# Patient Record
Sex: Female | Born: 1980 | Race: White | Hispanic: Yes | Marital: Married | Smoking: Never smoker
Health system: Southern US, Community
[De-identification: ages and names within clinical notes are randomized; demographics above are authoritative.]

## PROBLEM LIST (undated history)

## (undated) DIAGNOSIS — O009 Unspecified ectopic pregnancy without intrauterine pregnancy: Secondary | ICD-10-CM

## (undated) DIAGNOSIS — D649 Anemia, unspecified: Secondary | ICD-10-CM

## (undated) DIAGNOSIS — I8393 Asymptomatic varicose veins of bilateral lower extremities: Secondary | ICD-10-CM

## (undated) DIAGNOSIS — O09529 Supervision of elderly multigravida, unspecified trimester: Secondary | ICD-10-CM

## (undated) HISTORY — PX: OTHER SURGICAL HISTORY: SHX169

---

## 2002-09-22 ENCOUNTER — Inpatient Hospital Stay (HOSPITAL_COMMUNITY): Admission: AD | Admit: 2002-09-22 | Discharge: 2002-09-22 | Payer: Self-pay | Admitting: Family Medicine

## 2002-12-13 ENCOUNTER — Ambulatory Visit (HOSPITAL_COMMUNITY): Admission: RE | Admit: 2002-12-13 | Discharge: 2002-12-13 | Payer: Self-pay | Admitting: Obstetrics and Gynecology

## 2003-02-23 ENCOUNTER — Ambulatory Visit (HOSPITAL_COMMUNITY): Admission: RE | Admit: 2003-02-23 | Discharge: 2003-02-23 | Payer: Self-pay | Admitting: Obstetrics and Gynecology

## 2003-04-11 ENCOUNTER — Inpatient Hospital Stay (HOSPITAL_COMMUNITY): Admission: AD | Admit: 2003-04-11 | Discharge: 2003-04-11 | Payer: Self-pay | Admitting: Obstetrics and Gynecology

## 2003-05-22 ENCOUNTER — Inpatient Hospital Stay (HOSPITAL_COMMUNITY): Admission: AD | Admit: 2003-05-22 | Discharge: 2003-05-26 | Payer: Self-pay | Admitting: Obstetrics & Gynecology

## 2003-05-23 ENCOUNTER — Encounter (INDEPENDENT_AMBULATORY_CARE_PROVIDER_SITE_OTHER): Payer: Self-pay | Admitting: *Deleted

## 2005-11-21 ENCOUNTER — Encounter (INDEPENDENT_AMBULATORY_CARE_PROVIDER_SITE_OTHER): Payer: Self-pay | Admitting: Specialist

## 2005-11-21 ENCOUNTER — Ambulatory Visit: Payer: Self-pay | Admitting: Gynecology

## 2005-11-21 ENCOUNTER — Inpatient Hospital Stay (HOSPITAL_COMMUNITY): Admission: AD | Admit: 2005-11-21 | Discharge: 2005-11-21 | Payer: Self-pay | Admitting: Gynecology

## 2005-11-21 ENCOUNTER — Encounter: Payer: Self-pay | Admitting: Internal Medicine

## 2005-12-26 ENCOUNTER — Ambulatory Visit: Payer: Self-pay | Admitting: Gynecology

## 2006-02-12 ENCOUNTER — Other Ambulatory Visit: Admission: RE | Admit: 2006-02-12 | Discharge: 2006-02-12 | Payer: Self-pay | Admitting: Gynecology

## 2006-02-12 ENCOUNTER — Encounter (INDEPENDENT_AMBULATORY_CARE_PROVIDER_SITE_OTHER): Payer: Self-pay | Admitting: Specialist

## 2006-02-12 ENCOUNTER — Ambulatory Visit: Payer: Self-pay | Admitting: Gynecology

## 2006-03-04 ENCOUNTER — Ambulatory Visit: Payer: Self-pay | Admitting: Obstetrics & Gynecology

## 2006-06-23 ENCOUNTER — Ambulatory Visit (HOSPITAL_COMMUNITY): Admission: RE | Admit: 2006-06-23 | Discharge: 2006-06-23 | Payer: Self-pay | Admitting: Obstetrics & Gynecology

## 2006-08-25 ENCOUNTER — Ambulatory Visit (HOSPITAL_COMMUNITY): Admission: RE | Admit: 2006-08-25 | Discharge: 2006-08-25 | Payer: Self-pay | Admitting: Obstetrics & Gynecology

## 2006-10-03 ENCOUNTER — Ambulatory Visit: Payer: Self-pay | Admitting: Certified Nurse Midwife

## 2006-10-03 ENCOUNTER — Inpatient Hospital Stay (HOSPITAL_COMMUNITY): Admission: AD | Admit: 2006-10-03 | Discharge: 2006-10-03 | Payer: Self-pay | Admitting: Obstetrics & Gynecology

## 2006-10-07 ENCOUNTER — Ambulatory Visit: Payer: Self-pay | Admitting: Gynecology

## 2006-10-07 ENCOUNTER — Inpatient Hospital Stay (HOSPITAL_COMMUNITY): Admission: AD | Admit: 2006-10-07 | Discharge: 2006-10-09 | Payer: Self-pay | Admitting: Obstetrics & Gynecology

## 2006-10-20 ENCOUNTER — Ambulatory Visit: Payer: Self-pay | Admitting: Obstetrics & Gynecology

## 2006-10-23 ENCOUNTER — Ambulatory Visit: Payer: Self-pay | Admitting: Gynecology

## 2006-11-12 ENCOUNTER — Ambulatory Visit: Payer: Self-pay | Admitting: Gynecology

## 2010-09-17 NOTE — Op Note (Signed)
Katelyn Miller, Katelyn Miller    ACCOUNT NO.:  [id number]   MEDICAL RECORD NO.:  [id number]          PATIENT TYPE:  INP   LOCATION:  9102                          [facility]ILITY[facility]  [facility]   PHYSICIAN:  Ginger Carne, MD  DATE OF BIRTH:  [birth year]   DATE OF PROCEDURE:  10/07/2006  DATE OF DISCHARGE:                               OPERATIVE REPORT   PREOPERATIVE DIAGNOSIS:  Intrauterine pregnancy at 41+ weeks' gestation,  history of previous cesarean section, failed trial of labor after  cesarean section, nonreassuring fetal heart tracing, and had late  prenatal care.   POSTOPERATIVE DIAGNOSIS:  Intrauterine pregnancy at 41+ weeks'  gestation, history of previous cesarean section, failed trial of labor  after cesarean section, nonreassuring fetal heart tracing, and had late  prenatal care.   PROCEDURE:  Repeat low transverse cesarean section.   SURGEON:  Ginger Carne, M.D.   ASSISTANT:  Dr. Wilburt Finlay   ANESTHESIA:  Spinal.   SPECIMEN:  Placenta was sent to labor and delivery.   ESTIMATED BLOOD LOSS:  600 mL   COMPLICATIONS:  None.   FINDINGS:  Viable female infant, Apgars of eight and nine at 1 and 5  minutes respectively.  Birth weight 7 pounds 13 ounces.   INDICATIONS FOR PROCEDURE:  This is a 30 year old gravida 2, para 1-0-0-  1 at 41+ weeks gestation with a history of a previous cesarean section  who presented in early labor.  The patient was admitted desired to have  a trial of labor after cesarean section, was augmented with Pitocin,  then started to have variable decelerations with her contractions. She  spontaneously ruptured with light meconium and continued to have  persistent recurrent decelerations. These variable decelerations  continued despite positional changes, oxygen and an amnioinfusion. She  was taken for a repeat low transverse cesarean section.   PROCEDURE:  The patient was taken to the operating room where a spinal  anesthesia was found to be  adequate.  She was then prepped and draped in  the normal sterile fashion in dorsal supine position with a leftward  tilt.  A Pfannenstiel skin incision was then made with a scalpel and  carried through to the underlying layer of fascia.  The fascia was  incised in the midline and the incision extended laterally with the Mayo  scissors.  The superior aspect of the fascial incision was then grasped  with Kocher clamps, elevated and underlying rectus muscles dissected off  bluntly.  Attention was then turned to the inferior aspect of the  incision which in a similar fashion was grasped, tented up with the  Kocher clamps and the rectus muscles dissected off bluntly.  The rectus  muscles were then separated in midline and the peritoneum identified,  tented up and entered sharply with the Metzenbaum scissors. The  peritoneal incision was then extended superiorly and inferiorly with  good visualization of the bladder.  There was noted to be adhesions of  the vesicouterine peritoneum to the anterior abdominal wall and the  uterus.  The bladder blade was inserted and the vesicouterine peritoneum  identified, grasped with the pickups and entered sharply with the  Metzenbaum  scissors.  This incision was then extended laterally and the  bladder flap created digitally.  The bladder blade was then reinserted  and the lower uterine segment incised in a transverse fashion with a  scalpel.  The uterine incision was then extended bluntly.  The bladder  blade was removed and the infant's head delivered atraumatically. The  patient's nose and mouth were suctioned and nuchal cord x1 noted.  The  cord clamped and cut.  Infant was handed off to the waiting  pediatricians.  Cord blood was obtained.  The placenta was removed  manually.  The uterus cleared of all clots and debris. The uterine  incision was repaired with 0 Vicryl in a running locked fashion.  The  uterine incision was reinspected several times with  excellent hemostasis  noted. The gutters were cleared of all clots and the fascia was  reapproximated with 0 Vicryl in a running fashion.  Skin was closed with  staples.  The patient tolerated the procedure well.  Sponge, lap and  needle counts were correct x2.  2 grams of Ancef was given at cord  clamp.  The patient was taken to the recovery room in stable condition.     ______________________________  [name], MD      Ginger Carne, MD  Electronically Signed    LNJ/MEDQ  D:  10/07/2006  T:  10/07/2006  Job:  [id number]

## 2010-09-17 NOTE — Discharge Summary (Signed)
NAMELEILANI, Katelyn Miller    ACCOUNT NO.:  [id number]   MEDICAL RECORD NO.:  [id number]          PATIENT TYPE:  INP   LOCATION:  9102                          [facility]   PHYSICIAN:  Lesly Dukes, M.D. DATE OF BIRTH:  [birth year]   DATE OF ADMISSION:  10/07/2006  DATE OF DISCHARGE:  10/09/2006                               DISCHARGE SUMMARY   DISCHARGE DIAGNOSIS:  Repeat low transverse cesarean section with  delivery of viable female infant.   DISCHARGE MEDICATIONS:  1. Motrin 600 mg, one tablet q.6h. p.r.n. pain.  2. Percocet 5/325, one tablet every 4 hours as needed for pain.  3. Prenatal vitamins one tablet by mouth daily while breastfeeding.  4. Depo-Provera shot 150 mg IM, given prior to discharge.   FOLLOWUP INSTRUCTIONS:  The patient will have the [facility] nurse come  to her house between postop day #5 and postop day #7 in order to take  out her staples.  She will then follow up at the [facility]  Department for her 6 week postpartum check.   HOSPITAL COURSE:  This is a 30 year old gravida 2, para 2-0-0-2, who  presented to Triage in active laboratory with an intrauterine pregnancy  at 41 weeks and 4 days.  She was given a trial of labor after cesarean  section, however was taken for repeat low transverse cesarean section  for nonreassuring fetal heart tones.  Please see operative note for full  details of this procedure.  The patient had a routine postpartum course  and is agreeable to early discharge on postoperative day #2.  She is  both breast and bottle feeding and does not desire circumcision.  She  has opted to have a Depo-Provera shot prior to her discharge.   PERTINENT LABS:  Blood type O positive, syphilis nonreactive, rubella  immune, Hepatitis B Surface Antigen negative, GBS negative, and HIV  nonreactive.      Sylvan Cheese, M.D.      Lesly Dukes, M.D.  Electronically Signed    MJ/MEDQ  D:  10/09/2006  T:  10/09/2006   Job:  [id number]

## 2010-09-20 NOTE — Discharge Summary (Signed)
Katelyn Miller, Katelyn Miller                ACCOUNT NO.:  [id number]   MEDICAL RECORD NO.:  [id number]                   PATIENT TYPE:  INP   LOCATION:  9109                                 [facility]   PHYSICIAN:  Lesly Dukes, M.D.              DATE OF BIRTH:  Apr 30, 1981   DATE OF ADMISSION:  05/22/2003  DATE OF DISCHARGE:                                 DISCHARGE SUMMARY   DISCHARGE DIAGNOSES:  1. Low transverse cesarean section for arrest of dilatation.  2. There was a question of persistent occiput posterior.  3. The patient also had prolonged rupture of membranes and chorioamnionitis.   HOSPITAL COURSE:  The patient is a 30 year old gravida 1, who presented at  39-3/7 weeks on May 22, 2003, with spontaneous rupture of her membranes  for approximately five hours prior to presentation.  She was admitted and  her labor was augmented with Pitocin.  On January 17 throughout the day her  cervix slowly changed up to 4-5 cm.  This was only a minimal change from her  presentation, which was cervix of 3-4 cm.  Finally on the 18th at 1:30 in  the morning the patient, actually it was closer to 5:30, just after 5:30 in  the morning, the patient was taken for a low transverse C-section secondary  to arrest of dilatation and persistent OP.  This was done by Dr. Shawnie Pons.  The  patient up until that point was started on antibiotics secondary to a low-  grade temperature of 100.1 and prolonged rupture of membranes for over 20  hours.  Postpartum her temperatures continued to remain afebrile and she was  continued on cefoxitin until January 20.  The patient received antibiotics  for days from January 18-20.  Subsequently the patient did well postpartum,  no longer had any fevers, and remained afebrile prior to discharge.  Postop  day 3 the patient's vital signs are stable.  The wound was inspected.  There  was some mild erythema just superior to the incision.  There was no purulent  material.  The wound was dry.  There was no serosanguineous discharge.  The  area was nontender; however, the uterus was mildly tender as expected.  The  patient will be discharged home with a total of 10 days of Augmentin 875/125  mg p.o. b.i.d. and follow up at the [facility] in six weeks for  recheck.  Her staples will be removed prior to discharge.  She will also be  given a prescription for ferrous sulfate 325 mg one p.o. b.i.d., given that  her postop H&H was 8.6/24.6.  The patient will notify an M.D. with any  concerns whatsoever, including elevated temperature above 101.5 or any  concerns whatsoever.     Algis Greenhouse, M.D.                        Lesly Dukes, M.D.    CO/MEDQ  D:  05/26/2003  T:  05/26/2003  Job:  [id number]

## 2010-09-20 NOTE — Op Note (Signed)
NAMELAURAINE, CRESPO                ACCOUNT NO.:  [id number]   MEDICAL RECORD NO.:  [id number]                   PATIENT TYPE:  INP   LOCATION:  9109                                 [facility]   PHYSICIAN:  Tanya S. Shawnie Pons, M.D.                DATE OF BIRTH:  Jun 12, 1980   DATE OF PROCEDURE:  05/23/2003  DATE OF DISCHARGE:                                 OPERATIVE REPORT   PREOPERATIVE DIAGNOSES:  1. Intrauterine pregnancy at term.  2. Chorioamnionitis.  3. Arrest of dilation.  4. Persistent occiput posterior.   POSTOPERATIVE DIAGNOSES:  1. Intrauterine pregnancy at term.  2. Chorioamnionitis.  3. Arrest of dilation.  4. Persistent occiput posterior.   PROCEDURE:  Primary low transverse cesarean section.   SURGEON:  Shelbie Proctor. Shawnie Pons, M.D.   ASSISTANT:  Bradly Bienenstock, M.D.   ANESTHESIA:  Epidural, Dr. Jean Rosenthal.   FINDINGS:  A viable female infant, Apgars 9 and 9, weight 7 pounds 12 ounces.   ESTIMATED BLOOD LOSS:  600 mL.   COMPLICATIONS:  None.   SPECIMENS:  Placenta to pathology.   REASON FOR PROCEDURE:  The patient is a 30 year old gravida 1 who came in on  the day prior to delivery with PROM.  At that time she was observed for many  hours and then secondary to no change in cervical dilation, Pitocin was  started approximately 14 hours prior to delivery.  She received 14 hours of  IV Pitocin with no significant change in her cervix.  The baby was felt to  be OP.  She was placed in exaggerated Sims, given epidural, observed and no  change in her cervical dilation was appreciated.  She developed  chorioamnionitis.  She was started on antibiotics.  The baby was tachycardic  and had decreased variability.  At that time a decision was made to take her  to the OR for a C-section.   DESCRIPTION OF PROCEDURE:  The patient was taken to the OR, where epidural  anesthesia was dosed.  She was prepped and draped in the usual sterile  fashion.  Anesthesia was tested with  an Allis clamp.  A knife was used to  make a Pfannenstiel incision, carried down to the underlying fascia, which  was excised sharply.  The superior and inferior edges of the fascia were  dissected off the underlying rectus bluntly laterally and sharply in the  midline.  The peritoneal cavity was then entered bluntly and then the  incision extended by the surgeons pulling on either side.  A bladder flap  was then created on the uterus and a low transverse incision made their.  The amniotic fluid cavity was entered in the midline.  The incision was  extended laterally bluntly.  The infant was in an OP position and was  delivered atraumatically.  The cord was clamped x2, the infant bulb-  suctioned, and there was spontaneous crying, was taken to the awaiting  pediatricians.  The cord  blood was obtained and the placenta removed from  the uterus, the edges of the uterine incision grasped with ring forceps and  the uterine incision closed with 0 Vicryl suture in a locked running  fashion.  A second imbricating layer of 0 Vicryl was then used to complete  hemostasis.  There was no excessive bleeding from the incision site.  The  abdominal cavity was irrigated.  Again the incision site was looked at and  was found to be hemostatic.  Attention was then turned to the fascia, which  was closed with a 0 Vicryl suture in a running fashion.  The subcutaneous  tissue was irrigated and any bleeders cauterized with electrocautery, and  the skin closed using clips.  A pressure bandage was applied.  All  instrument and lap counts were correct x2.  The patient was awakened and  taken to the recovery room in stable condition.                                               Shelbie Proctor. Shawnie Pons, M.D.    TSP/MEDQ  D:  05/23/2003  T:  05/23/2003  Job:  [id number]

## 2010-09-20 NOTE — Op Note (Signed)
Katelyn Miller, Katelyn Miller    ACCOUNT NO.:  [id number]   MEDICAL RECORD NO.:  [id number]          PATIENT TYPE:  MAT   LOCATION:  MATC                          [facility]   PHYSICIAN:  Ginger Carne, MD  DATE OF BIRTH:  Mar 09, 1981   DATE OF PROCEDURE:  11/21/2005  DATE OF DISCHARGE:                                 OPERATIVE REPORT   PREOPERATIVE DIAGNOSIS:  Right tubal pregnancy.   POSTOPERATIVE DIAGNOSIS:  Right tubal pregnancy.   PROCEDURE:  Laparoscopic right salpingectomy.   SURGEON:  Ginger Carne, MD   ASSISTANT:  None.   COMPLICATIONS:  None immediate.   ESTIMATED BLOOD LOSS:  500 mL.   ANESTHESIA:  General.   SPECIMEN:  Right tube sent to pathology.   OPERATIVE FINDINGS:  Laparoscopic evaluation revealed 500-750 mL of clotted  fresh blood consistent with a right tubal pregnancy.  The tube was  unruptured but bleeding significantly from the fimbriated end.  The left  tube appeared normal.  Right and left ovaries normal.  No adhesive disease  was noted.  The uterus appeared to be normal in size.  Upper abdomen within  normal limits.   OPERATIVE PROCEDURE:  The patient prepped and draped in the usual fashion  and placed in the lithotomy position.  Betadine solution used for antiseptic  and the patient was catheterized prior to procedure.  After adequate general  anesthesia, a tenaculum placed on the anterior lip of the cervix and a  Hickman tenaculum placed in the endocervical canal.  Afterwards a vertical  infraumbilical incision was made and the Veress needle placed in the  abdomen.  Opening and closing pressures were 10 and 15 mmHg, needle release  trocar placed through the same incision and the laparoscope placed in the  trocar sleeve.  Two 5 mm ports were made in the left lower quadrant and left  hypogastric regions.  Afterwards inspection of the pelvis was carried out.  The right mesosalpinx was bipolar cauterized and cut to the isthmus  portion  of the tube.  The salpinx was then removed with an Endopouch bag without  difficulty.  No active bleeding noted at the cauterized sites.  Lactated  Ringer's with copious irrigation used to remove  clots.  Afterwards gas released, trocars removed.  Closure of the 10 mm  fascial site with 0 Vicryl suture and 4-0 Vicryl for subcuticular closure.  Instrument and sponge count were correct.  The patient tolerated the  procedure well and returned to the Post Anesthesia Recovery Room in  excellent condition.      Ginger Carne, MD  Electronically Signed     SHB/MEDQ  D:  11/21/2005  T:  11/22/2005  Job:  [id number]

## 2011-02-20 LAB — CBC
HCT: 30.2 — ABNORMAL LOW
Hemoglobin: 8.6 — ABNORMAL LOW
Hemoglobin: 9.9 — ABNORMAL LOW
MCHC: 34
RBC: 3.34 — ABNORMAL LOW
RBC: 4.05
RDW: 17.1 — ABNORMAL HIGH
WBC: 12.9 — ABNORMAL HIGH

## 2011-02-20 LAB — TYPE AND SCREEN: Antibody Screen: NEGATIVE

## 2012-03-24 ENCOUNTER — Other Ambulatory Visit (HOSPITAL_COMMUNITY): Payer: Self-pay | Admitting: Physician Assistant

## 2012-03-24 DIAGNOSIS — Z3689 Encounter for other specified antenatal screening: Secondary | ICD-10-CM

## 2012-03-24 LAB — OB RESULTS CONSOLE HEPATITIS B SURFACE ANTIGEN: Hepatitis B Surface Ag: NEGATIVE

## 2012-03-24 LAB — OB RESULTS CONSOLE RPR: RPR: NONREACTIVE

## 2012-03-30 ENCOUNTER — Ambulatory Visit (HOSPITAL_COMMUNITY)
Admission: RE | Admit: 2012-03-30 | Discharge: 2012-03-30 | Disposition: A | Discharge status: Home / Self Care | Payer: Medicaid Other | Source: Ambulatory Visit | Attending: Physician Assistant | Admitting: Physician Assistant

## 2012-03-30 DIAGNOSIS — Z3689 Encounter for other specified antenatal screening: Secondary | ICD-10-CM | POA: Insufficient documentation

## 2012-05-05 NOTE — L&D Delivery Note (Signed)
Delivery Note  Katelyn Miller is a 32 y.o. [id number] with two prior c-sections presenting in active labor desiring TOLAC. She had a very protracted latent phase of labor, with frequent late and variable decelerations and thick meconium stained amniotic fluid.  However, shet did eventually progress to complete dilation and pushed for less than 30 minutes to achieve successful VBAC.  At 3:25 AM a viable female was delivered via VBAC, Spontaneous (Presentation: ; Occiput Anterior).  APGAR: 8, 10; weight pending.   Placenta status: Intact, Spontaneous.  Cord: 3 vessels with the following complications: .  Cord pH: venous gas PENDING  Anesthesia: Epidural  Episiotomy: None Lacerations: Vaginal Suture Repair: 3.0 vicryl Est. Blood Loss (mL):  Mom to postpartum.  Baby to nursery-stable.  MCGILL,JACQUELYN 07/22/2012, 3:47 AM  I was present for entire delivery and agree with above resident note. Napoleon Form, MD

## 2012-06-30 LAB — OB RESULTS CONSOLE GC/CHLAMYDIA: Gonorrhea: NEGATIVE

## 2012-07-15 ENCOUNTER — Inpatient Hospital Stay (HOSPITAL_COMMUNITY)
Admission: AD | Admit: 2012-07-15 | Payer: Medicaid Other | Source: Ambulatory Visit | Admitting: Obstetrics & Gynecology

## 2012-07-15 SURGERY — Surgical Case
Anesthesia: Regional | Site: Abdomen

## 2012-07-20 ENCOUNTER — Encounter (HOSPITAL_COMMUNITY): Payer: Self-pay | Admitting: *Deleted

## 2012-07-20 ENCOUNTER — Inpatient Hospital Stay (HOSPITAL_COMMUNITY)
Admission: AD | Admit: 2012-07-20 | Discharge: 2012-07-20 | Disposition: A | Discharge status: Home / Self Care | Payer: Self-pay | Source: Ambulatory Visit | Attending: Obstetrics & Gynecology | Admitting: Obstetrics & Gynecology

## 2012-07-20 DIAGNOSIS — O479 False labor, unspecified: Secondary | ICD-10-CM | POA: Insufficient documentation

## 2012-07-20 DIAGNOSIS — O34219 Maternal care for unspecified type scar from previous cesarean delivery: Secondary | ICD-10-CM | POA: Insufficient documentation

## 2012-07-20 DIAGNOSIS — O471 False labor at or after 37 completed weeks of gestation: Secondary | ICD-10-CM

## 2012-07-20 NOTE — MAU Provider Note (Signed)
Chief Complaint:  Labor Eval  First Provider Initiated Contact with Patient 07/20/12 1536     HPI: Katelyn Miller is a 32 y.o. [id number] at [redacted]w[redacted]d who presents to maternity admissions reporting intermittent contractions since last night. Denies leakage of fluid or vaginal bleeding. Good fetal movement. Hx two prior cesareans. Plans VBAC.   Past Medical History: History reviewed. No pertinent past medical history.  Past obstetric history: OB History   Grav Para Term Preterm Abortions TAB SAB Ect Mult Living   3 2 2       2      # Outc Date GA Lbr Len/2nd Wgt Sex Del Anes PTL Lv   1 TRM 2005     CS      Comments: per pt slow labor and cord wrapped around neck   2 TRM 2008     CS      Comments: FRFHTs   3 CUR               Past Surgical History: Past Surgical History  Procedure Laterality Date  . Cesarean section      Family History: History reviewed. No pertinent family history.  Social History: History  Substance Use Topics  . Smoking status: Never Smoker   . Smokeless tobacco: Never Used  . Alcohol Use: No    Allergies: No Known Allergies  Meds:  Prescriptions prior to admission  Medication Sig Dispense Refill  . Prenatal Vit-Fe Fumarate-FA (PRENATAL MULTIVITAMIN) TABS Take 1 tablet by mouth daily at 12 noon.        ROS: Pertinent findings in history of present illness.  Physical Exam  Blood pressure 105/63, pulse 78, temperature 98.1 F (36.7 C), temperature source Oral, resp. rate 18, height 4' 11.5" (1.511 m), weight 70.308 kg (155 lb). GENERAL: Well-developed, well-nourished female in no acute distress.  HEENT: normocephalic HEART: normal rate RESP: normal effort ABDOMEN: Soft, non-tender, gravid appropriate for gestational age EXTREMITIES: Nontender, no edema NEURO: alert and oriented SPECULUM EXAM: Deferred  Dilation: Closed Effacement (%): Thick Cervical Position: Posterior Station: -3 Presentation: Undeterminable Exam by:: Sarajane Marek,  RNC  FHT:  Baseline 120-130 , moderate variability, accelerations present, no decelerations Contractions: q 7-8 mins   Labs: No results found for this or any previous visit (from the past 24 hour(s)).  Imaging:  No results found.  MAU Course:   Assessment: 1. False labor after 37 weeks of gestation without delivery     Plan: Discharge home Labor precautions and fetal kick counts. Uterine rupture precautions.       Follow-up Information   Follow up with [facility] In 1 day. (as scheduled)    Contact information:   [address] [place] [address] [phone]      Follow up with [facility] [place] [facility] ADMISS[facility]. (As needed if symptoms worsen)    Contact information:   [address] [place] [address] [phone]       Medication List    TAKE these medications       prenatal multivitamin Tabs  Take 1 tablet by mouth daily at 12 noon.        [place], [place] 07/20/2012 4:24 PM

## 2012-07-20 NOTE — MAU Note (Signed)
Contractions started last night, getting closer and stronger like every 5 min.  Small amt of blood this morning. No leaking of water. Planning for vbac

## 2012-07-21 ENCOUNTER — Inpatient Hospital Stay (HOSPITAL_COMMUNITY)
Admission: AD | Admit: 2012-07-21 | Discharge: 2012-07-23 | DRG: 775 | Disposition: A | Discharge status: Home / Self Care | Payer: Medicaid Other | Source: Ambulatory Visit | Attending: Obstetrics & Gynecology | Admitting: Obstetrics & Gynecology

## 2012-07-21 ENCOUNTER — Inpatient Hospital Stay (HOSPITAL_COMMUNITY): Payer: Medicaid Other | Admitting: Anesthesiology

## 2012-07-21 ENCOUNTER — Encounter (HOSPITAL_COMMUNITY): Payer: Self-pay | Admitting: *Deleted

## 2012-07-21 ENCOUNTER — Encounter (HOSPITAL_COMMUNITY): Payer: Self-pay | Admitting: Anesthesiology

## 2012-07-21 DIAGNOSIS — O34219 Maternal care for unspecified type scar from previous cesarean delivery: Secondary | ICD-10-CM | POA: Diagnosis present

## 2012-07-21 LAB — CBC
MCHC: 32.7 g/dL (ref 30.0–36.0)
Platelets: 381 10*3/uL (ref 150–400)
RDW: 14.1 % (ref 11.5–15.5)
WBC: 9.8 10*3/uL (ref 4.0–10.5)

## 2012-07-21 LAB — RPR: RPR Ser Ql: NONREACTIVE

## 2012-07-21 LAB — TYPE AND SCREEN: ABO/RH(D): O POS

## 2012-07-21 MED ORDER — EPHEDRINE 5 MG/ML INJ
10.0000 mg | INTRAVENOUS | Status: DC | PRN
  Filled 2012-07-21: qty 4

## 2012-07-21 MED ORDER — LACTATED RINGERS IV SOLN
INTRAVENOUS | Status: DC
  Administered 2012-07-21 – 2012-07-22 (×5): via INTRAVENOUS

## 2012-07-21 MED ORDER — PHENYLEPHRINE 40 MCG/ML (10ML) SYRINGE FOR IV PUSH (FOR BLOOD PRESSURE SUPPORT): 80.0000 ug | PREFILLED_SYRINGE | INTRAVENOUS | Status: DC | PRN

## 2012-07-21 MED ORDER — ONDANSETRON HCL 4 MG/2ML IJ SOLN: 4.0000 mg | Freq: Four times a day (QID) | INTRAMUSCULAR | Status: DC | PRN

## 2012-07-21 MED ORDER — LACTATED RINGERS IV SOLN
500.0000 mL | INTRAVENOUS | Status: DC | PRN
  Administered 2012-07-21: 200 mL via INTRAVENOUS
  Administered 2012-07-21: 300 mL via INTRAVENOUS
  Administered 2012-07-22: 500 mL via INTRAVENOUS

## 2012-07-21 MED ORDER — PHENYLEPHRINE 40 MCG/ML (10ML) SYRINGE FOR IV PUSH (FOR BLOOD PRESSURE SUPPORT)
80.0000 ug | PREFILLED_SYRINGE | INTRAVENOUS | Status: DC | PRN
  Filled 2012-07-21: qty 5

## 2012-07-21 MED ORDER — OXYTOCIN 40 UNITS IN LACTATED RINGERS INFUSION - SIMPLE MED
1.0000 m[IU]/min | INTRAVENOUS | Status: DC
  Administered 2012-07-21: 2 m[IU]/min via INTRAVENOUS

## 2012-07-21 MED ORDER — NALBUPHINE SYRINGE 5 MG/0.5 ML
10.0000 mg | INJECTION | INTRAMUSCULAR | Status: DC | PRN
  Administered 2012-07-21 (×2): 10 mg via INTRAVENOUS
  Filled 2012-07-21 (×2): qty 0.5
  Filled 2012-07-21: qty 1

## 2012-07-21 MED ORDER — LACTATED RINGERS IV SOLN
500.0000 mL | Freq: Once | INTRAVENOUS | Status: AC
  Administered 2012-07-21: 500 mL via INTRAVENOUS

## 2012-07-21 MED ORDER — TERBUTALINE SULFATE 1 MG/ML IJ SOLN: 0.2500 mg | Freq: Once | INTRAMUSCULAR | Status: AC | PRN

## 2012-07-21 MED ORDER — CITRIC ACID-SODIUM CITRATE 334-500 MG/5ML PO SOLN
30.0000 mL | ORAL | Status: DC | PRN
  Filled 2012-07-21: qty 15

## 2012-07-21 MED ORDER — OXYTOCIN BOLUS FROM INFUSION
500.0000 mL | INTRAVENOUS | Status: DC
  Administered 2012-07-22: 500 mL via INTRAVENOUS

## 2012-07-21 MED ORDER — OXYTOCIN 40 UNITS IN LACTATED RINGERS INFUSION - SIMPLE MED
62.5000 mL/h | INTRAVENOUS | Status: DC
  Filled 2012-07-21: qty 1000

## 2012-07-21 MED ORDER — LIDOCAINE HCL (PF) 1 % IJ SOLN
INTRAMUSCULAR | Status: DC | PRN
  Administered 2012-07-21 (×3): 4 mL

## 2012-07-21 MED ORDER — IBUPROFEN 600 MG PO TABS
600.0000 mg | ORAL_TABLET | Freq: Four times a day (QID) | ORAL | Status: DC | PRN
  Administered 2012-07-22: 600 mg via ORAL
  Filled 2012-07-21: qty 1

## 2012-07-21 MED ORDER — OXYTOCIN 40 UNITS IN LACTATED RINGERS INFUSION - SIMPLE MED: 1.0000 m[IU]/min | INTRAVENOUS | Status: DC

## 2012-07-21 MED ORDER — DIPHENHYDRAMINE HCL 50 MG/ML IJ SOLN: 12.5000 mg | INTRAMUSCULAR | Status: DC | PRN

## 2012-07-21 MED ORDER — EPHEDRINE 5 MG/ML INJ: 10.0000 mg | INTRAVENOUS | Status: DC | PRN

## 2012-07-21 MED ORDER — ACETAMINOPHEN 325 MG PO TABS: 650.0000 mg | ORAL_TABLET | ORAL | Status: DC | PRN

## 2012-07-21 MED ORDER — OXYCODONE-ACETAMINOPHEN 5-325 MG PO TABS: 1.0000 | ORAL_TABLET | ORAL | Status: DC | PRN

## 2012-07-21 MED ORDER — FENTANYL 2.5 MCG/ML BUPIVACAINE 1/10 % EPIDURAL INFUSION (WH - ANES): 12.0000 mL/h | INTRAMUSCULAR | Status: DC | PRN

## 2012-07-21 MED ORDER — LIDOCAINE HCL (PF) 1 % IJ SOLN
30.0000 mL | INTRAMUSCULAR | Status: DC | PRN
  Filled 2012-07-21: qty 30

## 2012-07-21 MED ORDER — FENTANYL 2.5 MCG/ML BUPIVACAINE 1/10 % EPIDURAL INFUSION (WH - ANES)
14.0000 mL/h | INTRAMUSCULAR | Status: DC | PRN
  Administered 2012-07-21: 12 mL/h via EPIDURAL
  Administered 2012-07-22: 14 mL/h via EPIDURAL
  Filled 2012-07-21 (×2): qty 125

## 2012-07-21 NOTE — Progress Notes (Signed)
Eda in room for interpretation during MD exam and update patient and family on plan of care

## 2012-07-21 NOTE — Progress Notes (Signed)
Patient ID: Katelyn Miller, female   DOB: 1981/03/31, 32 y.o.   MRN: [id number]  S:  Pt resting comfortably.  O:   Filed Vitals:   07/21/12 2102  BP: 101/58  Pulse: 96  Temp: 98 F (36.7 C)  Resp: 20    Cervix:  5/90/-1  MVUs: 180-230 for past 2 hours  FHTs:  120, mod var with periods of min variability, accels present, intermittent late decels Cat II  A/P 43 y.o. [id number] with 2 prior c-sections here with SOL desiring VBAC. - Cat II heart tones - monitor closely - No cervical change for 7 hours, at least 2 hours with adequate contractions.- - Will allow 2 more hours for change. If no change, despite adequate contractions, will proceed with repeat c-section.  Napoleon Form, MD

## 2012-07-21 NOTE — Progress Notes (Signed)
Interpreter at bedside to explain updated plan of care to patient

## 2012-07-21 NOTE — MAU Provider Note (Signed)
Attestation of Attending Supervision of Advanced Practitioner (PA/CNM/NP): Evaluation and management procedures were performed by the Advanced Practitioner under my supervision and collaboration.  I have reviewed the Advanced Practitioner's note and chart, and I agree with the management and plan.  Alex Mcmanigal, MD, FACOG Attending Obstetrician & Gynecologist Faculty Practice, [facility]  

## 2012-07-21 NOTE — Progress Notes (Signed)
Katelyn Miller is a 32 y.o. [id number] at [redacted]w[redacted]d admitted for early labor  Subjective: Feeling pressure, epidural helping  Objective: BP 116/67  Pulse 80  Temp(Src) 97.9 F (36.6 C) (Oral)  Resp 24  Ht 4\' 11"  (1.499 m)  Wt 69.4 kg (153 lb)  BMI 30.89 kg/m2  Fetal Heart FHR: 120 bpm, variability: moderate,  accelerations:  Present,  decelerations:  Present 5 late decels when monitor replaced after epidural placed   Contractions: q 2-3 min  SVE:   Dilation: 6 Effacement (%): 90 Station: -2 Exam by:: Dr. Ermalinda Memos   Assessment / Plan: [id number] with previous C/S x2 undergoing TOLAC  Labor: active phase labor progressing slowly, now on 6 milliunits/min of pitocin, AROM and IUPC placed Fetal Wellbeing: Category 2, some late decels have improved with oxygen, will also try position changes, reassuring fetal scalp stimulation and clear amniotic fluid Pain Control:  epidural Expected mode of delivery: NSVD  Katelyn Miller 07/21/2012, 4:11 PM  Evaluation and management procedures were performed by Resident physician under my supervision/collaboration. Chart reviewed, patient examined by me and I did AROM (large amount clear AF) and FSS, both reassuring. ?ROP> try exaggerated Sims. II agree with management and plan.

## 2012-07-21 NOTE — Anesthesia Procedure Notes (Signed)
Epidural Patient location during procedure: OB Start time: 07/21/2012 5:11 PM  Staffing Performed by: anesthesiologist   Preanesthetic Checklist Completed: patient identified, site marked, surgical consent, pre-op evaluation, timeout performed, IV checked, risks and benefits discussed and monitors and equipment checked  Epidural Patient position: sitting Prep: site prepped and draped and DuraPrep Patient monitoring: continuous pulse ox and blood pressure Approach: midline Injection technique: LOR air  Needle:  Needle type: Tuohy  Needle gauge: 17 G Needle length: 9 cm and 9 Needle insertion depth: 5.5 cm Catheter type: closed end flexible Catheter size: 19 Gauge Catheter at skin depth: 10.5 cm Test dose: negative  Assessment Events: blood not aspirated, injection not painful, no injection resistance, negative IV test and no paresthesia  Additional Notes Discussed risk of headache, infection, bleeding, nerve injury and failed or incomplete block.  Patient voices understanding and wishes to proceed.  Epidural placed easily on first attempt.  No paresthesia.  Patient tolerated procedure well with no apparent complications.  Jasmine December, MD Reason for block:procedure for pain

## 2012-07-21 NOTE — MAU Note (Signed)
Uc's since yesterday, but stronger tonight. Denies LOF or VB. Pt reports + FM.

## 2012-07-21 NOTE — Progress Notes (Signed)
Katelyn Miller is a 32 y.o. G3P2002 at [redacted]w[redacted]d by LMP admitted for active labor  Subjective:   Objective: BP 114/73  Pulse 74  Temp(Src) 98 F (36.7 C) (Oral)  Resp 20  Ht 4\' 11"  (1.499 m)  Wt 69.4 kg (153 lb)  BMI 30.89 kg/m2      FHT:  FHR: 120 bpm, variability: minimal ,  accelerations:  Present,  decelerations:  Absent UC:   regular, every 4-5 minutes SVE:   Dilation: 3 Effacement (%): 80 Station: -2 Exam by:: [employer]: Lab Results  Component Value Date   WBC 9.8 07/21/2012   HGB 10.5* 07/21/2012   HCT 32.1* 07/21/2012   MCV 79.9 07/21/2012   PLT 381 07/21/2012    Assessment / Plan: Protracted latent phase  Labor: Progressing normally Fetal Wellbeing:  Category I Pain Control:  Nubain I/D:  n/a Anticipated MOD:  VBAC  Gregor Hams 07/21/2012, 7:51 AM

## 2012-07-21 NOTE — Anesthesia Preprocedure Evaluation (Signed)
Anesthesia Evaluation  Patient identified by MRN, date of birth, ID band Patient awake    Reviewed: Allergy & Precautions, H&P , NPO status , Patient's Chart, lab work & pertinent test results, reviewed documented beta blocker date and time   History of Anesthesia Complications Negative for: history of anesthetic complications  Airway Mallampati: I TM Distance: >3 FB Neck ROM: full    Dental  (+) Teeth Intact   Pulmonary neg pulmonary ROS,  breath sounds clear to auscultation        Cardiovascular negative cardio ROS  Rhythm:regular Rate:Normal     Neuro/Psych negative neurological ROS  negative psych ROS   GI/Hepatic negative GI ROS, Neg liver ROS,   Endo/Other  negative endocrine ROS  Renal/GU negative Renal ROS     Musculoskeletal   Abdominal   Peds  Hematology  (+) Blood dyscrasia (hgb 10.5), anemia ,   Anesthesia Other Findings   Reproductive/Obstetrics (+) Pregnancy (h/o c/s x2, attempting VBAC)                           Anesthesia Physical Anesthesia Plan  ASA: II  Anesthesia Plan: Epidural   Post-op Pain Management:    Induction:   Airway Management Planned:   Additional Equipment:   Intra-op Plan:   Post-operative Plan:   Informed Consent: I have reviewed the patients History and Physical, chart, labs and discussed the procedure including the risks, benefits and alternatives for the proposed anesthesia with the patient or authorized representative who has indicated his/her understanding and acceptance.     Plan Discussed with:   Anesthesia Plan Comments:         Anesthesia Quick Evaluation

## 2012-07-21 NOTE — MAU Note (Signed)
PT SAYS SHE HURT BAD 7 PM.   VE  IN CLINIC - LITTLE BIT.   DENIES HSV AND MRSA

## 2012-07-21 NOTE — Progress Notes (Signed)
Interpreter in the room explaining to the patient about procedures, progress, and plan of care

## 2012-07-21 NOTE — Progress Notes (Addendum)
Katelyn Miller is a 32 y.o. [id number] at [redacted]w[redacted]d admitted for early labor  Subjective: Requests IV analgesia for painful UCs.  Objective: BP 94/52  Pulse 79  Temp(Src) 97.7 F (36.5 C) (Oral)  Resp 20  Ht 4\' 11"  (1.499 m)  Wt 69.4 kg (153 lb)  BMI 30.89 kg/m2  Fetal Heart FHR: 120 bpm, variability: moderate,  accelerations:  Present,  decelerations:  Absent   Contractions: q 3-5  SVE:   Dilation: 4 Effacement (%): 90 Station: -2 Exam by:: Dr. Ermalinda Memos (My exam)  Assessment / Plan: [id number] with previous C/S x2 undergoing TOLAC Labor: Early active phase labor Fetal Wellbeing: Category 1 Pain Control:  Assessing IV analgesic effect Expected mode of delivery: NSVD  Katelyn Miller 07/21/2012, 10:37 AM   We discussed augmentation with pitocin with the patient via interpreter due to inadequate contractions. The patient is agreeable and we will start pitocin 2X2.   Kevin Fenton MD 07/21/2012, 1046 am

## 2012-07-21 NOTE — H&P (Signed)
Katelyn Miller is a 32 y.o. female presenting for SOL. Maternal Medical History:  Reason for admission: Contractions.  Nausea.  Contractions: Onset was 3-5 hours ago.   Frequency: regular.   Duration is approximately 5 minutes.   Perceived severity is moderate.    Fetal activity: Perceived fetal activity is normal.   Last perceived fetal movement was within the past hour.    Prenatal complications: No bleeding, HIV, hypertension, IUGR, polyhydramnios, pre-eclampsia, preterm labor or substance abuse.     # SOL: Pt mentions regular contractions since 1400 on Monday afternoon and occuring regularly till presentation today.  Pt mentions continuous fetal movement without loss of fluid, discharge or blood.  Pt denies pain with urination, increased urinary frequency, headache, vision changes, RUQ pain, swelling of feet or ankles.    Pt was seen in MAU at 1536 for contraction pain, on cervical exam was 0/thick/long and d/c to home with education for onset of labor.    Pt seen in [facility] OCP for Birthcontrol   OB History   Grav Para Term Preterm Abortions TAB SAB Ect Mult Living   3 2 2       2      History reviewed. No pertinent past medical history. Past Surgical History  Procedure Laterality Date  . Cesarean section     Family History: family history is not on file. Social History:  reports that she has never smoked. She has never used smokeless tobacco. She reports that she does not drink alcohol or use illicit drugs.   Prenatal Transfer Tool  Maternal Diabetes: No Genetic Screening: Declined, late to care Maternal Ultrasounds/Referrals: Normal Fetal Ultrasounds or other Referrals:  None Maternal Substance Abuse:  No Significant Maternal Medications:  None Significant Maternal Lab Results:  Lab values include: Group B Strep negative Other Comments:  None  Review of Systems  Constitutional: Negative for fever and chills.  Eyes: Negative for blurred  vision and double vision.  Respiratory: Negative for cough, shortness of breath and wheezing.   Cardiovascular: Negative for chest pain, palpitations, orthopnea, claudication and leg swelling.  Gastrointestinal: Positive for abdominal pain. Negative for heartburn, nausea, vomiting, diarrhea and constipation.  Genitourinary: Negative for dysuria and urgency.  Musculoskeletal: Negative for myalgias and joint pain.  Skin: Negative for itching and rash.  Neurological: Negative for headaches.  Endo/Heme/Allergies: Negative for environmental allergies. Does not bruise/bleed easily.  All other systems reviewed and are negative.    Dilation: 2 Effacement (%): 90 Station: -2 Exam by:: Linderman MD Blood pressure 109/66, pulse 67, temperature 97.7 F (36.5 C), temperature source Oral, resp. rate 20, height 4\' 11"  (1.499 m), weight 69.57 kg (153 lb 6 oz). Maternal Exam:  Uterine Assessment: Contraction strength is moderate.  Contraction duration is 1 minute. Contraction frequency is regular.   Abdomen: Surgical scars: low transverse.   Estimated fetal weight is 55% by [place] on 03/30/12, no hx of GDM.    Introitus: Normal vulva. Normal vagina.  Ferning test: not done.  Nitrazine test: not done. Amniotic fluid character: not assessed.  Cervix: Cervix evaluated by digital exam.     Fetal Exam Fetal Monitor Review: Mode: ultrasound.   Baseline rate: 125.  Variability: moderate (6-25 bpm).   Pattern: accelerations present and no decelerations.    Fetal State Assessment: Category I - tracings are normal.     Physical Exam  Constitutional: She appears well-developed and well-nourished. No distress.  HENT:  Head: Normocephalic and atraumatic.  Eyes: EOM are normal.  Pupils are equal, round, and reactive to light.  Neck: Normal range of motion. Neck supple.  Cardiovascular: Normal rate, regular rhythm, normal heart sounds and intact distal pulses.  Exam reveals no gallop and no friction rub.    No murmur heard. Respiratory: Effort normal and breath sounds normal. No respiratory distress. She has no wheezes. She has no rales.  GI: Soft. Bowel sounds are normal. There is no tenderness. There is no rebound, no guarding and no CVA tenderness.  Genitourinary: Vagina normal and uterus normal.  Cervical exam: 2cm/70/-2 change from 0/thick/-3  Musculoskeletal: Normal range of motion. She exhibits no edema and no tenderness.  Neurological: She is alert.  Skin: Skin is warm and dry. She is not diaphoretic.  Psychiatric: She has a normal mood and affect. Her behavior is normal. Thought content normal.    FHR: Baseline 120, accelerations present, no decelerations, regular contractions,   Prenatal labs: ABO, Rh:  O positive Antibody:  Negative Rubella:  Immune RPR:   Negative HBsAg:   Negative HIV:   Non-reactive GBS:   Negative  Assessment/Plan: 32 y/o G3P2002 at [redacted]w[redacted]d with hx of 2 previous c-sections presenting with SOL  # SOL with trial  for VBAC -- pt with signed VBAC consent -- admit to L&D for expected management for VABC -- continuous NST monitoring  DISPO: Diet: thin Activity: up ab lib Nursing: Per floor protocol Electrolytes: replace as needed Prophylaxis: GI: none; DVT early ambulation Code: Full   Ancipitate the pt to be admitted for 2 midnight(s) for expected VBAC.        Gregor Hams 07/21/2012, 2:23 AM  I have seen and examined this patient and I agree with the above. SHAW, KIMBERLY 5:30 AM 07/21/2012

## 2012-07-21 NOTE — Progress Notes (Signed)
Katelyn Miller is a 32 y.o. [name] at [redacted]w[redacted]d admitted for early labor  Subjective: Feeling more pressure and pain, still wanting only IV pain meds  Objective: BP 117/60  Pulse 82  Temp(Src) 97.7 F (36.5 C) (Oral)  Resp 20  Ht 4\' 11"  (1.499 m)  Wt 69.4 kg (153 lb)  BMI 30.89 kg/m2  Fetal Heart FHR: 125 bpm, variability: moderate,  accelerations:  Present,  decelerations:  Absent   Contractions: q 3-4 min  SVE:   Dilation: 5 Effacement (%): 90 Station: -2 Exam by:: Dr. Ermalinda Miller   Assessment / Plan: [name] with previous C/S x2 undergoing TOLAC  Labor: active phase labor progressing normally, continue to titrate pitocin, now at 6 milliunits/min Fetal Wellbeing: Category 1 Pain Control:  Assessing IV analgesic effect, epidural by request Expected mode of delivery: NSVD  Katelyn Miller 07/21/2012, 2:16 PM   Evaluation and management procedures were performed by Resident physician under my supervision/collaboration. Chart reviewed, patient examined by me and I agree with management and plan.

## 2012-07-21 NOTE — H&P (Signed)

## 2012-07-21 NOTE — Progress Notes (Signed)
Patient ID: Katelyn Miller, female   DOB: 01/15/1981, 32 y.o.   MRN: [id number]  S:  Pt sleeping comfortably  O:   Filed Vitals:   07/21/12 2302  BP: 106/51  Pulse: 68  Temp:   Resp: 20    Cervix:  6.5/90/-1 to 0  FHTs:  130, mod variability with some periods of minimal variability, accels present (after cervical exam).  Decels present:  Lates, variables. Category II Had 3 min decel to 90s corrected with repositioning, likely due to prolonged contraction. Pitocin stopped.  MVUs:  180s, down to 140s off pitocin.   A/P 32 y.o. Q6V7846 with 2 prior c-sections presenting with SOL and desiring TOLAC. - Very small progression in past 2 hours - Cat II FHTs - MVUs adequate for most of the past 2 hours, more reassuring once pit stopped - Discussed options with patient. She still wants to try for vaginal delivery. - Will restart pit at 4 and monitor for 1-2 more hours as long as baby tolerates labor.  Napoleon Form, MD

## 2012-07-22 ENCOUNTER — Encounter (HOSPITAL_COMMUNITY): Payer: Self-pay | Admitting: *Deleted

## 2012-07-22 DIAGNOSIS — O34219 Maternal care for unspecified type scar from previous cesarean delivery: Secondary | ICD-10-CM

## 2012-07-22 MED ORDER — IBUPROFEN 600 MG PO TABS
600.0000 mg | ORAL_TABLET | Freq: Four times a day (QID) | ORAL | Status: DC
  Administered 2012-07-22 – 2012-07-23 (×5): 600 mg via ORAL
  Filled 2012-07-22 (×5): qty 1

## 2012-07-22 MED ORDER — PRENATAL MULTIVITAMIN CH
1.0000 | ORAL_TABLET | Freq: Every day | ORAL | Status: DC
  Administered 2012-07-22 – 2012-07-23 (×2): 1 via ORAL
  Filled 2012-07-22 (×2): qty 1

## 2012-07-22 MED ORDER — DIBUCAINE 1 % RE OINT: 1.0000 "application " | TOPICAL_OINTMENT | RECTAL | Status: DC | PRN

## 2012-07-22 MED ORDER — WITCH HAZEL-GLYCERIN EX PADS: 1.0000 "application " | MEDICATED_PAD | CUTANEOUS | Status: DC | PRN

## 2012-07-22 MED ORDER — ONDANSETRON HCL 4 MG PO TABS: 4.0000 mg | ORAL_TABLET | ORAL | Status: DC | PRN

## 2012-07-22 MED ORDER — SENNOSIDES-DOCUSATE SODIUM 8.6-50 MG PO TABS
2.0000 | ORAL_TABLET | Freq: Every day | ORAL | Status: DC
  Administered 2012-07-22: 2 via ORAL

## 2012-07-22 MED ORDER — TETANUS-DIPHTH-ACELL PERTUSSIS 5-2.5-18.5 LF-MCG/0.5 IM SUSP: 0.5000 mL | Freq: Once | INTRAMUSCULAR | Status: DC

## 2012-07-22 MED ORDER — BENZOCAINE-MENTHOL 20-0.5 % EX AERO
1.0000 "application " | INHALATION_SPRAY | CUTANEOUS | Status: DC | PRN
  Administered 2012-07-22: 1 via TOPICAL
  Filled 2012-07-22: qty 56

## 2012-07-22 MED ORDER — LANOLIN HYDROUS EX OINT: TOPICAL_OINTMENT | CUTANEOUS | Status: DC | PRN

## 2012-07-22 MED ORDER — SIMETHICONE 80 MG PO CHEW: 80.0000 mg | CHEWABLE_TABLET | ORAL | Status: DC | PRN

## 2012-07-22 MED ORDER — DIPHENHYDRAMINE HCL 25 MG PO CAPS: 25.0000 mg | ORAL_CAPSULE | Freq: Four times a day (QID) | ORAL | Status: DC | PRN

## 2012-07-22 MED ORDER — OXYCODONE-ACETAMINOPHEN 5-325 MG PO TABS
1.0000 | ORAL_TABLET | ORAL | Status: DC | PRN
  Administered 2012-07-22: 1 via ORAL
  Filled 2012-07-22: qty 1

## 2012-07-22 MED ORDER — ONDANSETRON HCL 4 MG/2ML IJ SOLN: 4.0000 mg | INTRAMUSCULAR | Status: DC | PRN

## 2012-07-22 MED ORDER — ZOLPIDEM TARTRATE 5 MG PO TABS: 5.0000 mg | ORAL_TABLET | Freq: Every evening | ORAL | Status: DC | PRN

## 2012-07-22 NOTE — Progress Notes (Signed)
UR chart review completed.  

## 2012-07-22 NOTE — Progress Notes (Signed)
Patient ID: Katelyn Miller, female   DOB: 1980-07-10, 32 y.o.   MRN: [id number]  S:  Pt resting comfortably.  OCeasar Mons Vitals:   07/21/12 2332 07/22/12 0002 07/22/12 0032 07/22/12 0102  BP: 111/50 103/51 91/48 99/50   Pulse: 77 71 80 80  Temp:  98.7 F (37.1 C)    TempSrc:  Oral    Resp: 20 18 18 18   Height:      Weight:      SpO2:        Dilation: 8.5 Effacement (%): 100 Cervical Position: Middle Station: -1 Presentation: Vertex Exam by:: Thad Ranger MD / Jimmye Norman RN  FHTs:  130, min to mod variability, accels absent. Decels:  Variables, lates, and a few earlies.  MVUs:  180-190 for past hour  A/P 31 y.o. G3P2002 at [redacted]w[redacted]d with SOL now augmented with pit, prior c-section x 2 - very protracted latent phase, now progressed to 8.5 cm - FHTs category II - monitoring closely - continue with pitocin, hoping for VBAC  Napoleon Form, MD

## 2012-07-22 NOTE — Progress Notes (Signed)
Patient in and out cathed by McGill, MD after delivery with estimated output of 50ml.  Urine expressed with fundal massage by MD.

## 2012-07-22 NOTE — Anesthesia Postprocedure Evaluation (Signed)
  Anesthesia Post-op Note  Patient: Katelyn Miller  Procedure(s) Performed: * No procedures listed *  Patient Location: PACU and Mother/Baby  Anesthesia Type:Epidural  Level of Consciousness: awake  Airway and Oxygen Therapy: Patient Spontanous Breathing  Post-op Pain: none  Post-op Assessment: Patient's Cardiovascular Status Stable, Respiratory Function Stable, Patent Airway, No signs of Nausea or vomiting, Adequate PO intake, Pain level controlled, No headache, No backache, No residual numbness and No residual motor weakness  Post-op Vital Signs: Reviewed and stable  Complications: No apparent anesthesia complications

## 2012-07-23 MED ORDER — IBUPROFEN 600 MG PO TABS: 600.0000 mg | ORAL_TABLET | Freq: Four times a day (QID) | ORAL | Status: DC

## 2012-07-23 NOTE — Discharge Summary (Signed)
Obstetric Discharge Summary Katelyn Miller is a 32 y.o. W2N5621 presenting at [redacted]w[redacted]d in active labor with 2 prior low transverse cesarean deliveries. She desired TOLAC and had signed consent. She had a prolonged latent phase of labor but did progress to complete dilation and had a successful VBAC. There were no complications of delivery, and post-partum course was unremarkable. She is breastfeeding and plans OCPs for contraception.  Reason for Admission: onset of labor Prenatal Procedures: none Intrapartum Procedures: spontaneous vaginal delivery Postpartum Procedures: none Complications-Operative and Postpartum: none  No complaints today. Feeling well. Appetite good. +urine, +flatus, no BM. Up ad lib. OCPs for contraception.  Ready to get home to her family.   Hemoglobin  Date Value Range Status  07/21/2012 10.5* 12.0 - 15.0 g/dL Final     HCT  Date Value Range Status  07/21/2012 32.1* 36.0 - 46.0 % Final    Physical Exam:  General: alert, cooperative, appears stated age and no distress Lochia: appropriate Uterine Fundus: firm DVT Evaluation: No evidence of DVT seen on physical exam.  Discharge Diagnoses: Term Pregnancy-delivered  Discharge Information: Date: 07/23/2012 Activity: unrestricted Diet: routine Medications: Ibuprofen Condition: stable Instructions: refer to practice specific booklet Discharge to: home  Follow-up Information   Call [facility] Dept-. (call in 1-2 days for appt in 4-6 weeks)    Contact information:   [address]  [address] [place] [address] [phone]       Newborn Data: Live born female  Birth Weight: 6 lb 14.8 oz (3140 g) APGAR: 8, 10  Home with mother.  [name], PA-S 07/23/2012, 7:17 AM  I saw and examined patient and reviewed labor course, delivery summary, labs and vitals. I agree with above student note. Discharge home today. F/U at [facility] Department.  Napoleon Form, MD

## 2014-03-06 ENCOUNTER — Encounter (HOSPITAL_COMMUNITY): Payer: Self-pay | Admitting: *Deleted

## 2015-01-12 ENCOUNTER — Ambulatory Visit (HOSPITAL_COMMUNITY)
Admission: RE | Admit: 2015-01-12 | Discharge: 2015-01-12 | Disposition: A | Discharge status: Home / Self Care | Payer: Commercial Managed Care - PPO | Source: Ambulatory Visit | Attending: Family Medicine | Admitting: Family Medicine

## 2015-01-12 ENCOUNTER — Ambulatory Visit (INDEPENDENT_AMBULATORY_CARE_PROVIDER_SITE_OTHER): Payer: Commercial Managed Care - PPO | Admitting: Family Medicine

## 2015-01-12 ENCOUNTER — Encounter (HOSPITAL_COMMUNITY): Payer: Self-pay

## 2015-01-12 ENCOUNTER — Ambulatory Visit (INDEPENDENT_AMBULATORY_CARE_PROVIDER_SITE_OTHER): Payer: Commercial Managed Care - PPO

## 2015-01-12 VITALS — BP 95/60 | HR 106 | Temp 98.9°F | Resp 16 | Ht 60.0 in | Wt 148.0 lb

## 2015-01-12 DIAGNOSIS — M549 Dorsalgia, unspecified: Secondary | ICD-10-CM

## 2015-01-12 DIAGNOSIS — M546 Pain in thoracic spine: Secondary | ICD-10-CM | POA: Diagnosis not present

## 2015-01-12 DIAGNOSIS — R1031 Right lower quadrant pain: Secondary | ICD-10-CM

## 2015-01-12 DIAGNOSIS — R1011 Right upper quadrant pain: Secondary | ICD-10-CM

## 2015-01-12 DIAGNOSIS — R509 Fever, unspecified: Secondary | ICD-10-CM

## 2015-01-12 LAB — POCT URINALYSIS DIPSTICK
BILIRUBIN UA: NEGATIVE
Glucose, UA: NEGATIVE
KETONES UA: 15
NITRITE UA: POSITIVE
PH UA: 6
Protein, UA: 30
Spec Grav, UA: 1.015
UROBILINOGEN UA: 0.2

## 2015-01-12 LAB — POCT CBC
GRANULOCYTE PERCENT: 87.2 % — AB (ref 37–80)
HCT, POC: 37.8 % (ref 37.7–47.9)
HEMOGLOBIN: 12.3 g/dL (ref 12.2–16.2)
Lymph, poc: 1.1 (ref 0.6–3.4)
MCH: 25.2 pg — AB (ref 27–31.2)
MCHC: 32.5 g/dL (ref 31.8–35.4)
MCV: 77.3 fL — AB (ref 80–97)
MID (cbc): 0.4 (ref 0–0.9)
MPV: 6.4 fL (ref 0–99.8)
PLATELET COUNT, POC: 325 10*3/uL (ref 142–424)
POC Granulocyte: 10.3 — AB (ref 2–6.9)
POC LYMPH PERCENT: 9.2 %L — AB (ref 10–50)
POC MID %: 3.6 %M (ref 0–12)
RBC: 4.89 M/uL (ref 4.04–5.48)
RDW, POC: 15.7 %
WBC: 11.8 10*3/uL — AB (ref 4.6–10.2)

## 2015-01-12 LAB — POCT UA - MICROSCOPIC ONLY
CASTS, UR, LPF, POC: NEGATIVE
CRYSTALS, UR, HPF, POC: NEGATIVE
Mucus, UA: NEGATIVE
Yeast, UA: NEGATIVE

## 2015-01-12 LAB — POCT URINE PREGNANCY: Preg Test, Ur: NEGATIVE

## 2015-01-12 MED ORDER — CIPROFLOXACIN HCL 500 MG PO TABS: 500.0000 mg | ORAL_TABLET | Freq: Two times a day (BID) | ORAL | Status: DC

## 2015-01-12 MED ORDER — CEFTRIAXONE SODIUM 1 G IJ SOLR
1.0000 g | Freq: Once | INTRAMUSCULAR | Status: AC
  Administered 2015-01-12: 1 g via INTRAMUSCULAR

## 2015-01-12 MED ORDER — ACETAMINOPHEN 325 MG PO TABS: 1000.0000 mg | ORAL_TABLET | Freq: Once | ORAL | Status: DC

## 2015-01-12 MED ORDER — IOHEXOL 300 MG/ML  SOLN
50.0000 mL | Freq: Once | INTRAMUSCULAR | Status: AC | PRN
  Administered 2015-01-12: 50 mL via ORAL

## 2015-01-12 NOTE — Progress Notes (Signed)
Pt came in for CT scan and was given PO contrast approximately 1pm.   When it was time to scan pt, several attempts were made to locate her and unable to.   Message was left on pt's phone and urgent care was notified that the pt left and was never returned at 4pm.

## 2015-01-12 NOTE — Patient Instructions (Addendum)
I suspect that you have a kidney infection- however we are going to do a CT scan to ensure there is no other problem  Please proceed to [facility] (radiology department- you can ask for direction at the ER) for your CT scan After your CT please remain at the hospital until I speak with you  Assuming that your CT is negative use the antibiotic (cipro) as directed, ibuprofen/ tylenol as needed and come back tomorrow for a recheck

## 2015-01-12 NOTE — Progress Notes (Addendum)
[facility] [address] [place] [address] [phone]  Date:  01/12/2015   Name:  Katelyn Miller   DOB:  [birth year]   MRN:  [id number]  PCP:  No primary care provider on file.    Chief Complaint: Fever; Headache; and Back Pain   History of Present Illness:  Katelyn Miller is a 34 y.o. very pleasant female patient who presents with the following:  Here today as a new pt with complaint of illness- she first felt ill 2 days ago. She noted a subjective fever, chills.  She also had body aches, headache.   She also notes back pain, more on the right side No cough No runny nose, no sore throat No dysuria No vomiting or diarrhea. She did not feel like eating yesterday or today but she is able to drink liquids  She is generally in good health  She has 3 boys. C/S X2 and one SVD No sick contacts at home  She took some ibuprofen early this am  There are no active problems to display for this patient.   No past medical history on file.  Past Surgical History  Procedure Laterality Date  . Cesarean section      Social History  Substance Use Topics  . Smoking status: Never Smoker   . Smokeless tobacco: Never Used  . Alcohol Use: No    No family history on file.  No Known Allergies  Medication list has been reviewed and updated.  Current Outpatient Prescriptions on File Prior to Visit  Medication Sig Dispense Refill  . ibuprofen (ADVIL,MOTRIN) 600 MG tablet Take 1 tablet (600 mg total) by mouth every 6 (six) hours. (Patient not taking: Reported on 01/12/2015) 30 tablet 0  . Prenatal Vit-Fe Fumarate-FA (PRENATAL MULTIVITAMIN) TABS Take 1 tablet by mouth daily at 12 noon.     No current facility-administered medications on file prior to visit.    Review of Systems:  As per HPI- otherwise negative. Ibuprofen at 0500 this am   Physical Examination: Filed Vitals:   01/12/15 1020  BP: 98/62  Pulse: 112  Temp: 98.9 F (37.2  C)  Resp: 16   Filed Vitals:   01/12/15 1020  Height: 5' (1.524 m)  Weight: 148 lb (67.132 kg)   Body mass index is 28.9 kg/(m^2). Ideal Body Weight: Weight in (lb) to have BMI = 25: 127.7  GEN: WDWN, NAD, Non-toxic, A & O x 3, looks well but feels warm HEENT: Atraumatic, Normocephalic. Neck supple. No masses, No LAD. Ears and Nose: No external deformity. CV: RRR, No M/G/R. No JVD. No thrill. No extra heart sounds. PULM: CTA B, no wheezes, crackles, rhonchi. No retractions. No resp. distress. No accessory muscle use. ABD: S, ND, +BS. No rebound. No HSM.  She has right sided tenderness- on first exam more in the RUQ but on secondary exam more in the RLQ GU: she has just started her menstruation- however no discharge noted from cervix.  No CMT, she does have mild right sided adnexal tenderness  EXTR: No c/c/e NEURO Normal gait.  PSYCH: Normally interactive. Conversant. Not depressed or anxious appearing.  Calm demeanor.   Results for orders placed or performed in visit on 01/12/15  POCT CBC  Result Value Ref Range   WBC 11.8 (A) 4.6 - 10.2 K/uL   Lymph, poc 1.1 0.6 - 3.4   POC LYMPH PERCENT 9.2 (A) 10 - 50 %L   MID (cbc) 0.4 0 - 0.9  POC MID % 3.6 0 - 12 %M   POC Granulocyte 10.3 (A) 2 - 6.9   Granulocyte percent 87.2 (A) 37 - 80 %G   RBC 4.89 4.04 - 5.48 M/uL   Hemoglobin 12.3 12.2 - 16.2 g/dL   HCT, POC 16.1 09.6 - 47.9 %   MCV 77.3 (A) 80 - 97 fL   MCH, POC 25.2 (A) 27 - 31.2 pg   MCHC 32.5 31.8 - 35.4 g/dL   RDW, POC 04.5 %   Platelet Count, POC 325 142 - 424 K/uL   MPV 6.4 0 - 99.8 fL  POCT UA - Microscopic Only  Result Value Ref Range   WBC, Ur, HPF, POC 8-18    RBC, urine, microscopic 0-1    Bacteria, U Microscopic many    Mucus, UA neg    Epithelial cells, urine per micros 2-6    Crystals, Ur, HPF, POC neg    Casts, Ur, LPF, POC neg    Yeast, UA neg   POCT urinalysis dipstick  Result Value Ref Range   Color, UA yellow    Clarity, UA clear    Glucose, UA  neg    Bilirubin, UA neg    Ketones, UA 15    Spec Grav, UA 1.015    Blood, UA small    pH, UA 6.0    Protein, UA 30    Urobilinogen, UA 0.2    Nitrite, UA pos    Leukocytes, UA small (1+) (A) Negative  POCT urine pregnancy  Result Value Ref Range   Preg Test, Ur Negative Negative    Lab tech commented that the pt had a very large amt of bacteria in her urine with "clumps" of bacteria on micro  [facility] reading (PRIMARY) by  Dr. Patsy Lager. CXR: negative  CHEST 2 VIEW  COMPARISON: None.  FINDINGS: Mediastinum and hilar structures normal. Lungs are clear. Heart size normal. No pleural effusion or pneumothorax. No acute bony abnormality.  IMPRESSION: No acute cardiopulmonary disease.  Given tylenol 1,000 mg once Assessment and Plan: Upper back pain - Plan: POCT CBC, POCT UA - Microscopic Only, POCT urinalysis dipstick, POCT urine pregnancy, DG Chest 2 View, acetaminophen (TYLENOL) tablet 975 mg  RUQ pain - Plan: cefTRIAXone (ROCEPHIN) injection 1 g, CT Abdomen Pelvis W Contrast  Fever of unknown origin - Plan: acetaminophen (TYLENOL) tablet 975 mg, ciprofloxacin (CIPRO) 500 MG tablet  RLQ abdominal pain - Plan: CT Abdomen Pelvis W Contrast  Here today with a clinical picture suggestive of pyelonephritis, but with abd pain needing further evaluation.   She agreed to go for a CT scan today.   Treated with 1gm of rocephin IM, rx for cipro to start tonight. Arranged CT for her with plans to follow-up on phone with resutls  Signed Abbe Amsterdam, MD  CT result not done as pt did not stay for her 2nd bottle of contrast.  Called pt but VM not set up.   Called back again- no answer.   Called her husband's number: no answer  Meds ordered this encounter  Medications  . acetaminophen (TYLENOL) tablet 975 mg    Sig:   . cefTRIAXone (ROCEPHIN) injection 1 g    Sig:     Order Specific Question:  Antibiotic Indication:    Answer:  Other Indication (list below)    Order  Specific Question:  Other Indication:    Answer:  pyelonephritis  . ciprofloxacin (CIPRO) 500 MG tablet    Sig: Take 1 tablet (  500 mg total) by mouth 2 (two) times daily.    Dispense:  20 tablet    Refill:  0    Called again on 9/10- was able to leave message. Asked her to call us or come in if any concerns

## 2015-08-09 LAB — OB RESULTS CONSOLE GC/CHLAMYDIA
CHLAMYDIA, DNA PROBE: NEGATIVE
Gonorrhea: NEGATIVE

## 2015-08-09 LAB — OB RESULTS CONSOLE HEPATITIS B SURFACE ANTIGEN: Hepatitis B Surface Ag: NEGATIVE

## 2015-08-09 LAB — OB RESULTS CONSOLE ABO/RH: RH Type: POSITIVE

## 2015-08-09 LAB — OB RESULTS CONSOLE ANTIBODY SCREEN: Antibody Screen: NEGATIVE

## 2015-08-09 LAB — OB RESULTS CONSOLE RPR: RPR: NONREACTIVE

## 2015-08-09 LAB — OB RESULTS CONSOLE RUBELLA ANTIBODY, IGM: Rubella: IMMUNE

## 2015-08-09 LAB — OB RESULTS CONSOLE HIV ANTIBODY (ROUTINE TESTING): HIV: NONREACTIVE

## 2016-01-10 LAB — OB RESULTS CONSOLE GBS: GBS: POSITIVE

## 2016-01-10 LAB — OB RESULTS CONSOLE GC/CHLAMYDIA
Chlamydia: NEGATIVE
GC PROBE AMP, GENITAL: NEGATIVE

## 2016-02-13 ENCOUNTER — Inpatient Hospital Stay (HOSPITAL_COMMUNITY)
Admission: AD | Admit: 2016-02-13 | Discharge: 2016-02-17 | DRG: 765 | Disposition: A | Discharge status: Home / Self Care | Payer: Medicaid Other | Source: Ambulatory Visit | Attending: Obstetrics & Gynecology | Admitting: Obstetrics & Gynecology

## 2016-02-13 ENCOUNTER — Encounter (HOSPITAL_COMMUNITY): Payer: Self-pay | Admitting: *Deleted

## 2016-02-13 DIAGNOSIS — O4202 Full-term premature rupture of membranes, onset of labor within 24 hours of rupture: Secondary | ICD-10-CM | POA: Diagnosis present

## 2016-02-13 DIAGNOSIS — O99824 Streptococcus B carrier state complicating childbirth: Secondary | ICD-10-CM | POA: Diagnosis present

## 2016-02-13 DIAGNOSIS — O34211 Maternal care for low transverse scar from previous cesarean delivery: Secondary | ICD-10-CM | POA: Diagnosis present

## 2016-02-13 DIAGNOSIS — Z3A40 40 weeks gestation of pregnancy: Secondary | ICD-10-CM

## 2016-02-13 DIAGNOSIS — D500 Iron deficiency anemia secondary to blood loss (chronic): Secondary | ICD-10-CM | POA: Diagnosis not present

## 2016-02-13 DIAGNOSIS — IMO0002 Reserved for concepts with insufficient information to code with codable children: Secondary | ICD-10-CM | POA: Diagnosis present

## 2016-02-13 DIAGNOSIS — O365930 Maternal care for other known or suspected poor fetal growth, third trimester, not applicable or unspecified: Secondary | ICD-10-CM | POA: Diagnosis present

## 2016-02-13 DIAGNOSIS — O9902 Anemia complicating childbirth: Secondary | ICD-10-CM | POA: Diagnosis present

## 2016-02-13 DIAGNOSIS — Z98891 History of uterine scar from previous surgery: Secondary | ICD-10-CM

## 2016-02-13 LAB — CBC
HEMATOCRIT: 28.9 % — AB (ref 36.0–46.0)
Hemoglobin: 9.6 g/dL — ABNORMAL LOW (ref 12.0–15.0)
MCH: 26.3 pg (ref 26.0–34.0)
MCHC: 33.2 g/dL (ref 30.0–36.0)
MCV: 79.2 fL (ref 78.0–100.0)
Platelets: 348 10*3/uL (ref 150–400)
RBC: 3.65 MIL/uL — ABNORMAL LOW (ref 3.87–5.11)
RDW: 14 % (ref 11.5–15.5)
WBC: 6.7 10*3/uL (ref 4.0–10.5)

## 2016-02-13 LAB — POCT FERN TEST: POCT Fern Test: POSITIVE

## 2016-02-13 LAB — TYPE AND SCREEN
ABO/RH(D): O POS
ANTIBODY SCREEN: NEGATIVE

## 2016-02-13 LAB — RPR: RPR Ser Ql: NONREACTIVE

## 2016-02-13 MED ORDER — PENICILLIN G POTASSIUM 5000000 UNITS IJ SOLR
2.5000 10*6.[IU] | INTRAVENOUS | Status: DC
  Administered 2016-02-13 – 2016-02-14 (×5): 2.5 10*6.[IU] via INTRAVENOUS
  Filled 2016-02-13 (×7): qty 2.5

## 2016-02-13 MED ORDER — OXYCODONE-ACETAMINOPHEN 5-325 MG PO TABS: 1.0000 | ORAL_TABLET | ORAL | Status: DC | PRN

## 2016-02-13 MED ORDER — LIDOCAINE HCL (PF) 1 % IJ SOLN
30.0000 mL | INTRAMUSCULAR | Status: DC | PRN
  Filled 2016-02-13: qty 30

## 2016-02-13 MED ORDER — LACTATED RINGERS IV SOLN: 500.0000 mL | INTRAVENOUS | Status: DC | PRN

## 2016-02-13 MED ORDER — SOD CITRATE-CITRIC ACID 500-334 MG/5ML PO SOLN
30.0000 mL | ORAL | Status: DC | PRN
  Administered 2016-02-14: 30 mL via ORAL
  Filled 2016-02-13 (×2): qty 15

## 2016-02-13 MED ORDER — TERBUTALINE SULFATE 1 MG/ML IJ SOLN
0.2500 mg | Freq: Once | INTRAMUSCULAR | Status: AC | PRN
  Administered 2016-02-14: 0.25 mg via SUBCUTANEOUS
  Filled 2016-02-13: qty 1

## 2016-02-13 MED ORDER — FLEET ENEMA 7-19 GM/118ML RE ENEM: 1.0000 | ENEMA | RECTAL | Status: DC | PRN

## 2016-02-13 MED ORDER — ONDANSETRON HCL 4 MG/2ML IJ SOLN: 4.0000 mg | Freq: Four times a day (QID) | INTRAMUSCULAR | Status: DC | PRN

## 2016-02-13 MED ORDER — OXYTOCIN 40 UNITS IN LACTATED RINGERS INFUSION - SIMPLE MED
1.0000 m[IU]/min | INTRAVENOUS | Status: DC
  Administered 2016-02-13: 1 m[IU]/min via INTRAVENOUS
  Filled 2016-02-13: qty 1000

## 2016-02-13 MED ORDER — FENTANYL CITRATE (PF) 100 MCG/2ML IJ SOLN
100.0000 ug | INTRAMUSCULAR | Status: DC | PRN
  Administered 2016-02-13 – 2016-02-14 (×3): 100 ug via INTRAVENOUS
  Filled 2016-02-13 (×2): qty 2

## 2016-02-13 MED ORDER — ACETAMINOPHEN 325 MG PO TABS: 650.0000 mg | ORAL_TABLET | ORAL | Status: DC | PRN

## 2016-02-13 MED ORDER — OXYCODONE-ACETAMINOPHEN 5-325 MG PO TABS: 2.0000 | ORAL_TABLET | ORAL | Status: DC | PRN

## 2016-02-13 MED ORDER — LACTATED RINGERS IV SOLN
INTRAVENOUS | Status: DC
  Administered 2016-02-13 – 2016-02-14 (×3): via INTRAVENOUS

## 2016-02-13 MED ORDER — OXYTOCIN BOLUS FROM INFUSION: 500.0000 mL | Freq: Once | INTRAVENOUS | Status: DC

## 2016-02-13 MED ORDER — OXYTOCIN 40 UNITS IN LACTATED RINGERS INFUSION - SIMPLE MED: 2.5000 [IU]/h | INTRAVENOUS | Status: DC

## 2016-02-13 MED ORDER — FENTANYL CITRATE (PF) 100 MCG/2ML IJ SOLN
INTRAMUSCULAR | Status: AC
  Filled 2016-02-13: qty 2

## 2016-02-13 MED ORDER — PENICILLIN G POTASSIUM 5000000 UNITS IJ SOLR
5.0000 10*6.[IU] | Freq: Once | INTRAVENOUS | Status: AC
  Administered 2016-02-13: 5 10*6.[IU] via INTRAVENOUS
  Filled 2016-02-13: qty 5

## 2016-02-13 NOTE — Progress Notes (Signed)
PT seen and examined. Doing well. Not feeling many contractions. Interpreter services used. VBAC consent signed. Foley bulb placed and pitocin started with max of 6 at this time. Will continue to expect SVD. Pt has been ruptures since 2300 on 02/12/2016. Monitor for fever as we approach 18 hours of rupture.

## 2016-02-13 NOTE — Progress Notes (Signed)
Patient ID: Katelyn LaniusRosalva Miller, female   DOB: [birth year], 35 y.o.   MRN: [id number]  Feeling some ctx; has gotten Fentanyl x 2; receiving PCN  VSS, afeb FHR 130s, +accels, no decels Ctx q 2-3 mins w/ Pit @ 344mu/min Cx 4-5/50--2; foley in vag  IUP@term  TOLAC ROM x 24 hrs GBS pos- PCN  Will increase Pit as able Anticipate SVD  Cam HaiSHAW, Christi Wirick CNM 02/13/2016 10:53 PM

## 2016-02-13 NOTE — Progress Notes (Signed)
Notified of pt arrival in MAU, positive fern, and cervical exam. Requested MD confirm presentation because of inability to be sure it is vertex. Will come to unit for confirmation.

## 2016-02-13 NOTE — MAU Note (Signed)
Pt reports contractions and leaking fluid since 2300.

## 2016-02-13 NOTE — Anesthesia Pain Management Evaluation Note (Signed)
  [facility] Pain Management Visit Note  Patient: Katelyn Miller, 35 y.o., female  "Hello I am a member of the anesthesia team at [facility]. We have an anesthesia team available at all times to provide care throughout the hospital, including epidural management and anesthesia for C-section. I don't know your plan for the delivery whether it a natural birth, water birth, IV sedation, nitrous supplementation, doula or epidural, but we want to meet your pain goals."   1.Was your pain managed to your expectations on prior hospitalizations?   Yes   2.What is your expectation for pain management during this hospitalization?     IV pain meds  3.How can we help you reach that goal? IV pain rx   Record the patient's initial score and the patient's pain goal.   Pain: 3  Pain Goal: 6 [facility] wants you to be able to say your pain was always managed very well.  Katelyn Miller 02/13/2016

## 2016-02-13 NOTE — H&P (Signed)
LABOR AND DELIVERY ADMISSION HISTORY AND PHYSICAL NOTE  Katelyn Miller is a 35 y.o. female G22P3003 with IUP at 52w1dby 13 week [place] for SROM (clear) at 2300. She has been having mild irregular contractions.  She reports positive fetal movement. She denies vaginal bleeding.  Prenatal History/Complications: - Hx of C/S x 2 for arrest of dilation (1st child) and NRFHTs (2nd child). Then had successful VBAC for 3rd child. Desires VBAC. Met with Dr. PKennon Roundson 12/07/15. No consent in chart. - Iron deficiency anemia (Hgb 9.3 on PNL) - SGA on 13 week [place] EFW 5.5%. EFW 35% on 17 week [place] Past Medical History: No past medical history on file.  Past Surgical History: Past Surgical History:  Procedure Laterality Date  . CESAREAN SECTION      Obstetrical History: OB History    Gravida Para Term Preterm AB Living   '4 3 3     3   '$ SAB TAB Ectopic Multiple Live Births           1      Social History: Social History   Social History  . Marital status: Single    Spouse name: N/A  . Number of children: N/A  . Years of education: N/A   Social History Main Topics  . Smoking status: Never Smoker  . Smokeless tobacco: Never Used  . Alcohol use No  . Drug use: No  . Sexual activity: Not on file   Other Topics Concern  . Not on file   Social History Narrative  . No narrative on file    Family History: No family history on file.  Allergies: No Known Allergies  Facility-Administered Medications Prior to Admission  Medication Dose Route Frequency Provider Last Rate Last Dose  . acetaminophen (TYLENOL) tablet 975 mg  975 mg Oral Once JDarreld Mclean MD       Prescriptions Prior to Admission  Medication Sig Dispense Refill Last Dose  . ciprofloxacin (CIPRO) 500 MG tablet Take 1 tablet (500 mg total) by mouth 2 (two) times daily. 20 tablet 0   . ibuprofen (ADVIL,MOTRIN) 600 MG tablet Take 1 tablet (600 mg total) by mouth every 6 (six) hours. (Patient not  taking: Reported on 01/12/2015) 30 tablet 0 Not Taking  . Prenatal Vit-Fe Fumarate-FA (PRENATAL MULTIVITAMIN) TABS Take 1 tablet by mouth daily at 12 noon.   Not Taking     Review of Systems   All systems reviewed and negative except as stated in HPI  Blood pressure 110/58, pulse (!) 57, temperature 98.1 F (36.7 C), temperature source Oral, resp. rate 16, height 5' (1.524 m), weight 73 kg (161 lb), SpO2 100 %, unknown if currently breastfeeding. General appearance: alert, cooperative and no distress Lungs: clear to auscultation bilaterally Heart: regular rate and rhythm Abdomen: soft, non-tender; bowel sounds normal Extremities: No calf swelling or tenderness Presentation: cephalic by UKoreaFetal monitoring: Category I Uterine activity: Mild irregular Dilation: 1 Effacement (%): Thick Exam by:: DBridgette HabermannRN   Prenatal labs: ABO, Rh: O/Positive/-- (04/06 0000) Antibody: Negative (04/06 0000) Rubella: Immune RPR: Nonreactive (04/06 0000)  HBsAg: Negative (04/06 0000)  HIV: Non-reactive (04/06 0000)  GBS: Positive (09/07 0000)  1 hr Glucola: 112 Genetic screening:  CF screen negative, quad screen normal Anatomy [place] SGA on 13 week [place] AGA on 17 week [place] Prenatal Transfer Tool  Maternal Diabetes: No Genetic Screening: Normal Maternal Ultrasounds/Referrals: Abnormal:  Findings:   Other: SGA Fetal Ultrasounds or other  Referrals:  None Maternal Substance Abuse:  No Significant Maternal Medications:  None Significant Maternal Lab Results: Lab values include: Group B Strep positive  Results for orders placed or performed during the hospital encounter of 02/13/16 (from the past 24 hour(s))  Fern Test   Collection Time: 02/13/16  6:48 AM  Result Value Ref Range   POCT Fern Test Positive = ruptured amniotic membanes     There are no active problems to display for this patient.   Assessment: Katelyn Miller is a 35 y.o. G4P3003 at 76w1dhere for SROM.  #Labor: SROM  (clear) at 2300. Will see how she progresses spontaneously. #Pain: Does not want epidural #FWB: Category I #ID: GBS positive- PCN #MOF: Both #MOC: Not decided #Circ: n/a- girl  KEvette Doffing10/03/2016, 7:35 AM  CNM attestation:  I have seen and examined this patient; I agree with above documentation in the resident's note.   Katelyn Miller a 35y.o. [id number] for SROM; previous C/S  PE: BP (!) 95/48 Comment: denies dizziness, adv to get staff assist to get up  Pulse (!) 56   Temp 98.5 F (36.9 C)   Resp 20   Ht 5' (1.524 m)   Wt 73 kg (161 lb)   SpO2 99%   Breastfeeding? Unknown   BMI 31.44 kg/m  Gen: calm comfortable, NAD Resp: normal effort, no distress Abd: gravid  ROS, labs, PMH reviewed  Plan: Admit to BSunGardFoley placed initially w/ low dose Pit started Anticipate SVD  SHAW, KIMBERLY CNM 02/15/2016, 1:25 PM

## 2016-02-14 ENCOUNTER — Inpatient Hospital Stay (HOSPITAL_COMMUNITY): Payer: Medicaid Other | Admitting: Anesthesiology

## 2016-02-14 ENCOUNTER — Encounter (HOSPITAL_COMMUNITY)
Admission: AD | Disposition: A | Discharge status: Home / Self Care | Payer: Self-pay | Source: Ambulatory Visit | Attending: Obstetrics & Gynecology

## 2016-02-14 ENCOUNTER — Encounter (HOSPITAL_COMMUNITY): Payer: Self-pay | Admitting: *Deleted

## 2016-02-14 DIAGNOSIS — O9902 Anemia complicating childbirth: Secondary | ICD-10-CM

## 2016-02-14 DIAGNOSIS — Z3A40 40 weeks gestation of pregnancy: Secondary | ICD-10-CM

## 2016-02-14 DIAGNOSIS — O4202 Full-term premature rupture of membranes, onset of labor within 24 hours of rupture: Secondary | ICD-10-CM

## 2016-02-14 DIAGNOSIS — O365930 Maternal care for other known or suspected poor fetal growth, third trimester, not applicable or unspecified: Secondary | ICD-10-CM

## 2016-02-14 DIAGNOSIS — D509 Iron deficiency anemia, unspecified: Secondary | ICD-10-CM

## 2016-02-14 DIAGNOSIS — O99824 Streptococcus B carrier state complicating childbirth: Secondary | ICD-10-CM

## 2016-02-14 DIAGNOSIS — O34211 Maternal care for low transverse scar from previous cesarean delivery: Secondary | ICD-10-CM

## 2016-02-14 LAB — CBC
HCT: 27.2 % — ABNORMAL LOW (ref 36.0–46.0)
Hemoglobin: 9.1 g/dL — ABNORMAL LOW (ref 12.0–15.0)
MCH: 26.4 pg (ref 26.0–34.0)
MCHC: 33.5 g/dL (ref 30.0–36.0)
MCV: 78.8 fL (ref 78.0–100.0)
PLATELETS: 308 10*3/uL (ref 150–400)
RBC: 3.45 MIL/uL — ABNORMAL LOW (ref 3.87–5.11)
RDW: 14.2 % (ref 11.5–15.5)
WBC: 8.7 10*3/uL (ref 4.0–10.5)

## 2016-02-14 SURGERY — Surgical Case
Anesthesia: Epidural | Site: Abdomen | Wound class: Clean Contaminated

## 2016-02-14 MED ORDER — OXYTOCIN 40 UNITS IN LACTATED RINGERS INFUSION - SIMPLE MED
1.0000 m[IU]/min | INTRAVENOUS | Status: DC
  Administered 2016-02-14: 1 m[IU]/min via INTRAVENOUS
  Filled 2016-02-14: qty 1000

## 2016-02-14 MED ORDER — PHENYLEPHRINE 40 MCG/ML (10ML) SYRINGE FOR IV PUSH (FOR BLOOD PRESSURE SUPPORT)
PREFILLED_SYRINGE | INTRAVENOUS | Status: AC
  Filled 2016-02-14: qty 20

## 2016-02-14 MED ORDER — LACTATED RINGERS IV SOLN
INTRAVENOUS | Status: DC | PRN
  Administered 2016-02-14: 13:00:00 via INTRAVENOUS

## 2016-02-14 MED ORDER — CEFAZOLIN SODIUM-DEXTROSE 2-3 GM-% IV SOLR
INTRAVENOUS | Status: DC | PRN
  Administered 2016-02-14: 2 g via INTRAVENOUS

## 2016-02-14 MED ORDER — LIDOCAINE-EPINEPHRINE (PF) 2 %-1:200000 IJ SOLN
INTRAMUSCULAR | Status: AC
  Filled 2016-02-14: qty 20

## 2016-02-14 MED ORDER — PHENYLEPHRINE 40 MCG/ML (10ML) SYRINGE FOR IV PUSH (FOR BLOOD PRESSURE SUPPORT): 80.0000 ug | PREFILLED_SYRINGE | INTRAVENOUS | Status: DC | PRN

## 2016-02-14 MED ORDER — SODIUM CHLORIDE 0.9 % IR SOLN
Status: DC | PRN
  Administered 2016-02-14: 1000 mL

## 2016-02-14 MED ORDER — ACETAMINOPHEN 325 MG PO TABS
650.0000 mg | ORAL_TABLET | ORAL | Status: DC | PRN
  Administered 2016-02-16 (×2): 650 mg via ORAL
  Filled 2016-02-14 (×2): qty 2

## 2016-02-14 MED ORDER — BUPIVACAINE HCL (PF) 0.5 % IJ SOLN
INTRAMUSCULAR | Status: DC | PRN
  Administered 2016-02-14: 25 mL

## 2016-02-14 MED ORDER — FENTANYL 2.5 MCG/ML BUPIVACAINE 1/10 % EPIDURAL INFUSION (WH - ANES)
INTRAMUSCULAR | Status: AC
  Filled 2016-02-14: qty 125

## 2016-02-14 MED ORDER — AZITHROMYCIN 500 MG PO TABS
1000.0000 mg | ORAL_TABLET | Freq: Once | ORAL | Status: AC
  Administered 2016-02-14: 1000 mg via ORAL
  Filled 2016-02-14: qty 2

## 2016-02-14 MED ORDER — SCOPOLAMINE 1 MG/3DAYS TD PT72
MEDICATED_PATCH | TRANSDERMAL | Status: AC
  Filled 2016-02-14: qty 1

## 2016-02-14 MED ORDER — OXYCODONE HCL 5 MG PO TABS: 10.0000 mg | ORAL_TABLET | ORAL | Status: DC | PRN

## 2016-02-14 MED ORDER — DIBUCAINE 1 % RE OINT: 1.0000 "application " | TOPICAL_OINTMENT | RECTAL | Status: DC | PRN

## 2016-02-14 MED ORDER — PRENATAL MULTIVITAMIN CH
1.0000 | ORAL_TABLET | Freq: Every day | ORAL | Status: DC
  Administered 2016-02-15 – 2016-02-16 (×2): 1 via ORAL
  Filled 2016-02-14 (×2): qty 1

## 2016-02-14 MED ORDER — MENTHOL 3 MG MT LOZG: 1.0000 | LOZENGE | OROMUCOSAL | Status: DC | PRN

## 2016-02-14 MED ORDER — SIMETHICONE 80 MG PO CHEW
80.0000 mg | CHEWABLE_TABLET | Freq: Three times a day (TID) | ORAL | Status: DC
  Administered 2016-02-14 – 2016-02-17 (×7): 80 mg via ORAL
  Filled 2016-02-14 (×8): qty 1

## 2016-02-14 MED ORDER — OXYTOCIN 10 UNIT/ML IJ SOLN
INTRAVENOUS | Status: DC | PRN
  Administered 2016-02-14: 40 [IU] via INTRAVENOUS

## 2016-02-14 MED ORDER — CEFAZOLIN SODIUM-DEXTROSE 2-4 GM/100ML-% IV SOLN
INTRAVENOUS | Status: AC
  Filled 2016-02-14: qty 100

## 2016-02-14 MED ORDER — KETOROLAC TROMETHAMINE 30 MG/ML IJ SOLN
INTRAMUSCULAR | Status: AC
  Filled 2016-02-14: qty 1

## 2016-02-14 MED ORDER — OXYTOCIN 40 UNITS IN LACTATED RINGERS INFUSION - SIMPLE MED: 2.5000 [IU]/h | INTRAVENOUS | Status: AC

## 2016-02-14 MED ORDER — ONDANSETRON HCL 4 MG/2ML IJ SOLN
INTRAMUSCULAR | Status: AC
  Filled 2016-02-14: qty 2

## 2016-02-14 MED ORDER — MEPERIDINE HCL 25 MG/ML IJ SOLN
INTRAMUSCULAR | Status: DC | PRN
  Administered 2016-02-14 (×2): 12.5 mg via INTRAVENOUS

## 2016-02-14 MED ORDER — PHENYLEPHRINE 40 MCG/ML (10ML) SYRINGE FOR IV PUSH (FOR BLOOD PRESSURE SUPPORT)
80.0000 ug | PREFILLED_SYRINGE | INTRAVENOUS | Status: DC | PRN
  Administered 2016-02-14: 80 ug via INTRAVENOUS

## 2016-02-14 MED ORDER — LIDOCAINE-EPINEPHRINE (PF) 2 %-1:200000 IJ SOLN
INTRAMUSCULAR | Status: DC | PRN
  Administered 2016-02-14: 6 mL via INTRADERMAL
  Administered 2016-02-14 (×2): 5 mL

## 2016-02-14 MED ORDER — MEPERIDINE HCL 25 MG/ML IJ SOLN
INTRAMUSCULAR | Status: AC
  Filled 2016-02-14: qty 1

## 2016-02-14 MED ORDER — OXYCODONE HCL 5 MG PO TABS
5.0000 mg | ORAL_TABLET | ORAL | Status: DC | PRN
  Administered 2016-02-16 – 2016-02-17 (×2): 5 mg via ORAL
  Filled 2016-02-14 (×2): qty 1

## 2016-02-14 MED ORDER — BUPIVACAINE HCL (PF) 0.5 % IJ SOLN
INTRAMUSCULAR | Status: AC
  Filled 2016-02-14: qty 30

## 2016-02-14 MED ORDER — DIPHENHYDRAMINE HCL 25 MG PO CAPS: 25.0000 mg | ORAL_CAPSULE | Freq: Four times a day (QID) | ORAL | Status: DC | PRN

## 2016-02-14 MED ORDER — COCONUT OIL OIL: 1.0000 "application " | TOPICAL_OIL | Status: DC | PRN

## 2016-02-14 MED ORDER — WITCH HAZEL-GLYCERIN EX PADS: 1.0000 "application " | MEDICATED_PAD | CUTANEOUS | Status: DC | PRN

## 2016-02-14 MED ORDER — TETANUS-DIPHTH-ACELL PERTUSSIS 5-2.5-18.5 LF-MCG/0.5 IM SUSP: 0.5000 mL | Freq: Once | INTRAMUSCULAR | Status: DC

## 2016-02-14 MED ORDER — SENNOSIDES-DOCUSATE SODIUM 8.6-50 MG PO TABS
2.0000 | ORAL_TABLET | ORAL | Status: DC
  Administered 2016-02-15 – 2016-02-16 (×3): 2 via ORAL
  Filled 2016-02-14 (×3): qty 2

## 2016-02-14 MED ORDER — LACTATED RINGERS IV SOLN: 500.0000 mL | Freq: Once | INTRAVENOUS | Status: DC

## 2016-02-14 MED ORDER — KETOROLAC TROMETHAMINE 30 MG/ML IJ SOLN
30.0000 mg | Freq: Once | INTRAMUSCULAR | Status: AC
  Administered 2016-02-14: 30 mg via INTRAMUSCULAR

## 2016-02-14 MED ORDER — ONDANSETRON HCL 4 MG/2ML IJ SOLN
INTRAMUSCULAR | Status: DC | PRN
  Administered 2016-02-14: 4 mg via INTRAVENOUS

## 2016-02-14 MED ORDER — MORPHINE SULFATE (PF) 0.5 MG/ML IJ SOLN
INTRAMUSCULAR | Status: AC
  Filled 2016-02-14: qty 10

## 2016-02-14 MED ORDER — LIDOCAINE HCL (PF) 1 % IJ SOLN
INTRAMUSCULAR | Status: DC | PRN
  Administered 2016-02-14 (×2): 5 mL

## 2016-02-14 MED ORDER — SIMETHICONE 80 MG PO CHEW
80.0000 mg | CHEWABLE_TABLET | ORAL | Status: DC | PRN
  Administered 2016-02-16: 80 mg via ORAL

## 2016-02-14 MED ORDER — SIMETHICONE 80 MG PO CHEW
80.0000 mg | CHEWABLE_TABLET | ORAL | Status: DC
  Administered 2016-02-15 – 2016-02-16 (×2): 80 mg via ORAL
  Filled 2016-02-14 (×3): qty 1

## 2016-02-14 MED ORDER — EPHEDRINE 5 MG/ML INJ: 10.0000 mg | INTRAVENOUS | Status: DC | PRN

## 2016-02-14 MED ORDER — OXYTOCIN 10 UNIT/ML IJ SOLN
INTRAMUSCULAR | Status: AC
  Filled 2016-02-14: qty 4

## 2016-02-14 MED ORDER — SCOPOLAMINE 1 MG/3DAYS TD PT72
MEDICATED_PATCH | TRANSDERMAL | Status: DC | PRN
  Administered 2016-02-14: 1 via TRANSDERMAL

## 2016-02-14 MED ORDER — DIPHENHYDRAMINE HCL 50 MG/ML IJ SOLN: 12.5000 mg | INTRAMUSCULAR | Status: DC | PRN

## 2016-02-14 MED ORDER — IBUPROFEN 600 MG PO TABS
600.0000 mg | ORAL_TABLET | Freq: Four times a day (QID) | ORAL | Status: DC
Start: 2016-02-14 — End: 2016-02-17
  Administered 2016-02-14 – 2016-02-17 (×11): 600 mg via ORAL
  Filled 2016-02-14 (×11): qty 1

## 2016-02-14 MED ORDER — LACTATED RINGERS IV SOLN
INTRAVENOUS | Status: DC | PRN
  Administered 2016-02-14 (×3): via INTRAVENOUS

## 2016-02-14 MED ORDER — ZOLPIDEM TARTRATE 5 MG PO TABS: 5.0000 mg | ORAL_TABLET | Freq: Every evening | ORAL | Status: DC | PRN

## 2016-02-14 MED ORDER — SODIUM BICARBONATE 8.4 % IV SOLN
INTRAVENOUS | Status: AC
  Filled 2016-02-14: qty 50

## 2016-02-14 MED ORDER — LACTATED RINGERS IV SOLN
INTRAVENOUS | Status: DC
  Administered 2016-02-14 – 2016-02-15 (×2): via INTRAVENOUS

## 2016-02-14 MED ORDER — CEFAZOLIN SODIUM-DEXTROSE 2-4 GM/100ML-% IV SOLN: 2.0000 g | INTRAVENOUS | Status: DC

## 2016-02-14 MED ORDER — FENTANYL CITRATE (PF) 100 MCG/2ML IJ SOLN
INTRAMUSCULAR | Status: AC
  Filled 2016-02-14: qty 2

## 2016-02-14 MED ORDER — FENTANYL 2.5 MCG/ML BUPIVACAINE 1/10 % EPIDURAL INFUSION (WH - ANES)
14.0000 mL/h | INTRAMUSCULAR | Status: DC | PRN
  Administered 2016-02-14: 14 mL/h via EPIDURAL

## 2016-02-14 MED ORDER — LACTATED RINGERS IV SOLN
INTRAVENOUS | Status: DC
  Administered 2016-02-14: 10:00:00 via INTRAUTERINE

## 2016-02-14 MED ORDER — FENTANYL CITRATE (PF) 100 MCG/2ML IJ SOLN
25.0000 ug | INTRAMUSCULAR | Status: DC | PRN
  Administered 2016-02-14 (×2): 25 ug via INTRAVENOUS

## 2016-02-14 MED ORDER — MORPHINE SULFATE (PF) 0.5 MG/ML IJ SOLN
INTRAMUSCULAR | Status: DC | PRN
  Administered 2016-02-14: 4 mg via EPIDURAL
  Administered 2016-02-14: 1 mg via INTRAVENOUS

## 2016-02-14 SURGICAL SUPPLY — 42 items
APL SKNCLS STERI-STRIP NONHPOA (GAUZE/BANDAGES/DRESSINGS) ×1
BENZOIN TINCTURE PRP APPL 2/3 (GAUZE/BANDAGES/DRESSINGS) ×3 IMPLANT
BLADE TIP J-PLASMA PRECISE LAP (MISCELLANEOUS) ×3 IMPLANT
CHLORAPREP W/TINT 26ML (MISCELLANEOUS) ×3 IMPLANT
CLAMP CORD UMBIL (MISCELLANEOUS) IMPLANT
CLOSURE WOUND 1/2 X4 (GAUZE/BANDAGES/DRESSINGS) ×1
CLOTH BEACON ORANGE TIMEOUT ST (SAFETY) ×3 IMPLANT
DRSG OPSITE POSTOP 4X10 (GAUZE/BANDAGES/DRESSINGS) ×3 IMPLANT
DRSG PAD ABDOMINAL 8X10 ST (GAUZE/BANDAGES/DRESSINGS) ×2 IMPLANT
ELECT REM PT RETURN 9FT ADLT (ELECTROSURGICAL) ×3
ELECTRODE REM PT RTRN 9FT ADLT (ELECTROSURGICAL) ×1 IMPLANT
EXTRACTOR VACUUM KIWI (MISCELLANEOUS) IMPLANT
GAUZE SPONGE 4X4 12PLY STRL LF (GAUZE/BANDAGES/DRESSINGS) ×2 IMPLANT
GLOVE BIO SURGEON STRL SZ7 (GLOVE) ×3 IMPLANT
GLOVE BIOGEL PI IND STRL 7.0 (GLOVE) ×2 IMPLANT
GLOVE BIOGEL PI INDICATOR 7.0 (GLOVE) ×4
GOWN STRL REUS W/TWL LRG LVL3 (GOWN DISPOSABLE) ×6 IMPLANT
GOWN STRL REUS W/TWL XL LVL3 (GOWN DISPOSABLE) ×3 IMPLANT
HEMOSTAT ARISTA ABSORB 3G PWDR (MISCELLANEOUS) ×2 IMPLANT
KIT ABG SYR 3ML LUER SLIP (SYRINGE) IMPLANT
NDL HYPO 25X5/8 SAFETYGLIDE (NEEDLE) IMPLANT
NEEDLE HYPO 22GX1.5 SAFETY (NEEDLE) ×3 IMPLANT
NEEDLE HYPO 25X5/8 SAFETYGLIDE (NEEDLE) IMPLANT
NS IRRIG 1000ML POUR BTL (IV SOLUTION) ×3 IMPLANT
PACK C SECTION WH (CUSTOM PROCEDURE TRAY) ×3 IMPLANT
PAD OB MATERNITY 4.3X12.25 (PERSONAL CARE ITEMS) ×3 IMPLANT
PENCIL SMOKE EVAC W/HOLSTER (ELECTROSURGICAL) ×3 IMPLANT
RTRCTR C-SECT PINK 25CM LRG (MISCELLANEOUS) IMPLANT
SPONGE LAP 18X18 X RAY DECT (DISPOSABLE) ×4 IMPLANT
SPONGE SURGIFOAM ABS GEL 12-7 (HEMOSTASIS) IMPLANT
STRIP CLOSURE SKIN 1/2X4 (GAUZE/BANDAGES/DRESSINGS) ×2 IMPLANT
SUT PDS AB 0 CTX 60 (SUTURE) IMPLANT
SUT PLAIN 0 NONE (SUTURE) IMPLANT
SUT SILK 0 TIES 10X30 (SUTURE) IMPLANT
SUT VIC AB 0 CT1 36 (SUTURE) ×11 IMPLANT
SUT VIC AB 3-0 CT1 27 (SUTURE) ×6
SUT VIC AB 3-0 CT1 TAPERPNT 27 (SUTURE) ×1 IMPLANT
SUT VIC AB 4-0 KS 27 (SUTURE) ×2 IMPLANT
SYR CONTROL 10ML LL (SYRINGE) ×3 IMPLANT
TAPE CLOTH SURG 4X10 WHT LF (GAUZE/BANDAGES/DRESSINGS) ×2 IMPLANT
TOWEL OR 17X24 6PK STRL BLUE (TOWEL DISPOSABLE) ×3 IMPLANT
TRAY FOLEY CATH SILVER 14FR (SET/KITS/TRAYS/PACK) ×3 IMPLANT

## 2016-02-14 NOTE — Anesthesia Procedure Notes (Signed)
Epidural Patient location during procedure: OB  Staffing Anesthesiologist: Korde Jeppsen Performed: anesthesiologist   Preanesthetic Checklist Completed: patient identified, site marked, surgical consent, pre-op evaluation, timeout performed, IV checked, risks and benefits discussed and monitors and equipment checked  Epidural Patient position: sitting Prep: DuraPrep Patient monitoring: heart rate, continuous pulse ox and blood pressure Approach: right paramedian Location: L3-L4 Injection technique: LOR saline  Needle:  Needle type: Tuohy  Needle gauge: 17 G Needle length: 9 cm and 9 Needle insertion depth: 5 cm Catheter type: closed end flexible Catheter size: 20 Guage Catheter at skin depth: 10 cm Test dose: negative  Assessment Events: blood not aspirated, injection not painful, no injection resistance, negative IV test and no paresthesia  Additional Notes Patient identified. Risks/Benefits/Options discussed with patient including but not limited to bleeding, infection, nerve damage, paralysis, failed block, incomplete pain control, headache, blood pressure changes, nausea, vomiting, reactions to medication both or allergic, itching and postpartum back pain. Confirmed with bedside nurse the patient's most recent platelet count. Confirmed with patient that they are not currently taking any anticoagulation, have any bleeding history or any family history of bleeding disorders. Patient expressed understanding and wished to proceed. All questions were answered. Sterile technique was used throughout the entire procedure. Please see nursing notes for vital signs. Test dose was given through epidural needle and negative prior to continuing to dose epidural or start infusion. Warning signs of high block given to the patient including shortness of breath, tingling/numbness in hands, complete motor block, or any concerning symptoms with instructions to call for help. Patient was given  instructions on fall risk and not to get out of bed. All questions and concerns addressed with instructions to call with any issues.     

## 2016-02-14 NOTE — Transfer of Care (Signed)
Immediate Anesthesia Transfer of Care Note  Patient: Katelyn Miller  Procedure(s) Performed: Procedure(s): CESAREAN SECTION (N/A)  Patient Location: PACU  Anesthesia Type:Epidural  Level of Consciousness: awake, alert  and oriented  Airway & Oxygen Therapy: Patient Spontanous Breathing  Post-op Assessment: Report given to RN and Post -op Vital signs reviewed and stable  Post vital signs: Reviewed and stable  Last Vitals:  Vitals:   02/14/16 1212 02/14/16 1213  BP: (!) 109/92   Pulse: (!) 125   Resp: 18 20  Temp:      Last Pain:  Vitals:   02/14/16 1201  TempSrc:   PainSc: 0-No pain         Complications: No apparent anesthesia complications

## 2016-02-14 NOTE — Anesthesia Preprocedure Evaluation (Signed)
Anesthesia Evaluation  Patient identified by MRN, date of birth, ID band Patient awake    Reviewed: Allergy & Precautions, H&P , NPO status , Patient's Chart, lab work & pertinent test results, reviewed documented beta blocker date and time   History of Anesthesia Complications Negative for: history of anesthetic complications  Airway Mallampati: II  TM Distance: >3 FB Neck ROM: full    Dental no notable dental hx. (+) Teeth Intact   Pulmonary neg pulmonary ROS,    Pulmonary exam normal breath sounds clear to auscultation       Cardiovascular Exercise Tolerance: Good negative cardio ROS Normal cardiovascular exam Rhythm:regular Rate:Normal     Neuro/Psych negative neurological ROS  negative psych ROS   GI/Hepatic negative GI ROS, Neg liver ROS,   Endo/Other  negative endocrine ROS  Renal/GU negative Renal ROS  negative genitourinary   Musculoskeletal   Abdominal   Peds  Hematology negative hematology ROS (+)   Anesthesia Other Findings   Reproductive/Obstetrics negative OB ROS (+) Pregnancy                             Anesthesia Physical Anesthesia Plan  ASA: II  Anesthesia Plan: General   Post-op Pain Management:    Induction:   Airway Management Planned:   Additional Equipment:   Intra-op Plan:   Post-operative Plan:   Informed Consent: I have reviewed the patients History and Physical, chart, labs and discussed the procedure including the risks, benefits and alternatives for the proposed anesthesia with the patient or authorized representative who has indicated his/her understanding and acceptance.   Dental Advisory Given  Plan Discussed with: CRNA  Anesthesia Plan Comments:         Anesthesia Quick Evaluation

## 2016-02-14 NOTE — Progress Notes (Signed)
Patient ID: Casimer LaniusRosalva Miller, female   DOB: 16-Jul-1980, 35 y.o.   MRN: [id number]  Ctx stronger; just got Fentanyl VSS, afeb FHR 130s, +accels, no decels Ctx q 2-3 mins w/ Pit @ 2514mu/min Cx 6+/60/-2; BBOW- AROM for clear/? Light mec  IUP@term  TOLAC ROM x >24hrs  Anticipate labor to increase now that water is fully broken  Cam HaiSHAW, KIMBERLY CNM 02/14/2016 5:19 AM

## 2016-02-14 NOTE — Progress Notes (Signed)
Patient seen and evaluated. Patient has had recurring in weight or variable decelerations with contractions. Her period has been turned off at this time and she continues to contract every 4-5 minutes. Patient was comfortable with epidural. I did place an IUPC and as patient had leaking of fluid with cervical check and manipulation patient had a significant variable deceleration. At that time amnioinfusion with 300 mL bolus and 150 mL an hour infusion was initiated. Following that patient had several contractions with no decelerations. Her MVU use were approximately 125-150. Given that this is not quite adequate we will start Pitocin one by one. Patient's cervix was 7/80 and -1 station with some minimal swelling of the anterior cervix. We will recheck her cervix in approximately 4 hours and monitor for change.

## 2016-02-14 NOTE — Consult Note (Signed)
[facility]  Delivery Note:  C-section       02/14/2016  12:37 PM  I was called to the operating room at the request of the patient's obstetrician (Dr. Erin FullingHarraway Smith) for a repeat c-section due to non reassuring fetal heart tones.  PRENATAL HX:  This is a 35 y/o G4P3003 at 8640 and 2/[redacted] weeks gestation who was admitted on 10/11 for SROM the night before at 2300 (ROM ~40 hours).  She is GBS positive and received adequate treatment.  No fever.  The fetus began to have non reassuring heart tones so delivery was by c-section.    DELIVERY:  Infant was vigorous at delivery, requiring no resuscitation other than standard warming, drying and stimulation.  APGARs 8 and 9.  Exam notable for molding, otherwise within normal limits.  After 5 minutes, baby left with nurse to assist parents with skin-to-skin care.   _____________________ Electronically Signed By: Maryan CharLindsey Kaily Wragg, MD Neonatologist

## 2016-02-14 NOTE — Anesthesia Postprocedure Evaluation (Signed)
Anesthesia Post Note  Patient: Katelyn Miller  Procedure(s) Performed: Procedure(s) (LRB): CESAREAN SECTION (N/A)  Patient location during evaluation: PACU Anesthesia Type: Epidural Level of consciousness: awake and alert Pain management: pain level controlled Vital Signs Assessment: post-procedure vital signs reviewed and stable Respiratory status: spontaneous breathing, nonlabored ventilation and respiratory function stable Cardiovascular status: stable Postop Assessment: no headache, no backache and epidural receding Anesthetic complications: no     Last Vitals:  Vitals:   02/14/16 1550 02/14/16 1650  BP: (!) 101/56 (!) 102/51  Pulse: 61 (!) 58  Resp: 18 16  Temp: 37.1 C 36.9 C    Last Pain:  Vitals:   02/14/16 1650  TempSrc: Oral  PainSc:    Pain Goal:                 Linton RumpJennifer Dickerson Mio Schellinger

## 2016-02-14 NOTE — Op Note (Signed)
Katelyn Miller PROCEDURE DATE: 02/13/2016 - 02/14/2016  PREOPERATIVE DIAGNOSIS: Intrauterine pregnancy at  30107w2d weeks gestation; previous c-section, failed vbac, fetal indications  POSTOPERATIVE DIAGNOSIS: The same  PROCEDURE: Repeat Low Transverse Cesarean Section  SURGEON:  Dr. Eber Jonesarolyn L. Harraway-Smith                      Ernestina PennaNicholas Shanise Balch MD - Fellow  ASSISTANT:  None  Complications: none immediate  INDICATIONS: Katelyn Miller is a 35 y.o. [id number] at 36107w2d here for cesarean section secondary to the indications listed under preoperative diagnosis; please see preoperative note for further details.  The risks of cesarean section were discussed with the patient including but were not limited to: bleeding which may require transfusion or reoperation; infection which may require antibiotics; injury to bowel, bladder, ureters or other surrounding organs; injury to the fetus; need for additional procedures including hysterectomy in the event of a life-threatening hemorrhage; placental abnormalities wth subsequent pregnancies, incisional problems, thromboembolic phenomenon and other postoperative/anesthesia complications.   The patient concurred with the proposed plan, giving informed written consent for the procedure.    FINDINGS:  Viable female infant in cephalic presentation.  Apgars 8 and 9.  Clear amniotic fluid.  Intact placenta, three vessel cord.  Normal uterus, fallopian tubes and ovaries bilaterally.  PROCEDURE IN DETAIL:  The patient preoperatively received intravenous antibiotics and had sequential compression devices applied to her lower extremities.  Patients epidural was bolused in the room before moving to the OR.  She was then placed in a dorsal supine position with a leftward tilt, and prepped and draped in a sterile manner.  A foley catheter was already in place and attached to constant gravity.  After an adequate timeout was performed, a Pfannenstiel skin  incision was made 2cm above her previous scar with scalpel and carried through to the underlying layer of fascia. The fascia was incised in the midline, and this incision was extended bilaterally using the Mayo scissors.  Kocher clamps were applied to the superior aspect of the fascial incision and the underlying rectus muscles were dissected off bluntly. A similar process was carried out on the inferior aspect of the fascial incision. The rectus muscles were separated in the midline bluntly and the peritoneum was entered bluntly.  Attention was turned to the lower uterine segment where a low transverse hysterotomy incision was made with a scalpel and extended bilaterally bluntly.  The infant was successfully delivered, the cord was clamped and cut and the infant was handed over to awaiting neonatology team. The placenta was delivered manually. Uterine massage was then administered.  The placenta was intact with a three-vessel cord. The uterus was then cleared of clot and debris.  The hysterotomy was closed with 0 Vicryl in a running locked fashion, however the urine was noted to be slightly bloody and the margins of the bladder where unclear. The uterine incision was reopened and the bladder was filled with sterile milk. There was no leaking of fluid from her bladder and the hysterotomy was re-appoximated with 0-vircyl. The left margin of the incision had some bleeding noted between the hysterotomy and the bladder and this space was closed with 0-vircryl . The uterus was returned to the pelvis. The pelvis was cleared of all clot and debris. Hemostasis was confirmed on all surfaces.  The peritoneum and the muscles were reapproximated using 3-0 Vicryl with 1 interrupted suture. The fascia was then closed using 0 Vicryl.  The skin was closed with a  4-0 Vicryl subcuticular stitch.  25 cc of 0.5% marcaine was injected into the incision and benzoin and steristrips were applied.  The patient tolerated the procedure well.  Sponge, lap, instrument and needle counts were correct x 2.  She was taken to the recovery room in stable condition.

## 2016-02-14 NOTE — Progress Notes (Signed)
Patient informed of NRFHT of her child. Given that is she not progressing and has NRFHT despite amnioinfusion and no pitocin. Pt is prepared for OR and is agreeable.  The risks of cesarean section discussed with the patient included but were not limited to: bleeding which may require transfusion or reoperation; infection which may require antibiotics; injury to bowel, bladder, ureters or other surrounding organs; injury to the fetus; need for additional procedures including hysterectomy in the event of a life-threatening hemorrhage; placental abnormalities wth subsequent pregnancies, incisional problems, thromboembolic phenomenon and other postoperative/anesthesia complications. The patient concurred with the proposed plan, giving informed written consent for the procedure.   Patient has been NPO aside from clears for 24 hours she will remain NPO for procedure. Anesthesia and OR aware. Preoperative prophylactic antibiotics (cefazolin and azithromycin) and SCDs ordered on call to the OR.  To OR when ready.

## 2016-02-15 ENCOUNTER — Encounter (HOSPITAL_COMMUNITY): Payer: Self-pay | Admitting: Obstetrics & Gynecology

## 2016-02-15 LAB — CBC
HEMATOCRIT: 19.8 % — AB (ref 36.0–46.0)
HEMOGLOBIN: 6.9 g/dL — AB (ref 12.0–15.0)
MCH: 27.6 pg (ref 26.0–34.0)
MCHC: 34.8 g/dL (ref 30.0–36.0)
MCV: 79.2 fL (ref 78.0–100.0)
Platelets: 204 10*3/uL (ref 150–400)
RBC: 2.5 MIL/uL — ABNORMAL LOW (ref 3.87–5.11)
RDW: 14.2 % (ref 11.5–15.5)
WBC: 9.2 10*3/uL (ref 4.0–10.5)

## 2016-02-15 MED ORDER — FERROUS FUMARATE 324 (106 FE) MG PO TABS
1.0000 | ORAL_TABLET | Freq: Two times a day (BID) | ORAL | Status: DC
  Administered 2016-02-15 – 2016-02-17 (×5): 106 mg via ORAL
  Filled 2016-02-15 (×5): qty 1

## 2016-02-15 NOTE — Lactation Note (Signed)
This note was copied from a baby's chart. Lactation Consultation Note Experienced  BF mom,this is 4th baby. Mom stated that she BF her others for 8 months and 7 months. Denies infections or problems. Mom had baby swaddled in blanket in cradle position, stated baby just finished BF. Stated BF going well. Encouraged to BF STS, no blankets, open moms gown as well. Mom has semi flat nipple, breast semi compressible. Easily expressed colostrum. Discussed feeding positions options.  Referred to Baby and Me Book in Breastfeeding section Pg. 22-23 for position options and Proper latch demonstration. Educated about newborn behavior, STS, I&O, supply and demand. Mom chooses to breast formula feed. She will formula feed when going places. Encouraged to pump and have storage to give BM instead of formula for bottle feeding. WH/LC brochure given w/resources, support groups and LC services. Patient Name: Katelyn Casimer LaniusRosalva Miller BMWUX'LToday's Date: 02/15/2016 Reason for consult: Initial assessment   Maternal Data Has patient been taught Hand Expression?: Yes Does the patient have breastfeeding experience prior to this delivery?: Yes  Feeding Feeding Type: Breast Fed  LATCH Score/Interventions Latch: Repeated attempts needed to sustain latch, nipple held in mouth throughout feeding, stimulation needed to elicit sucking reflex. Intervention(s): Assist with latch;Breast compression  Audible Swallowing: A few with stimulation  Type of Nipple: Flat (semi flat)  Comfort (Breast/Nipple): Soft / non-tender     Hold (Positioning): Assistance needed to correctly position infant at breast and maintain latch. Intervention(s): Breastfeeding basics reviewed;Support Pillows;Position options;Skin to skin  LATCH Score: 7  Lactation Tools Discussed/Used WIC Program: Yes   Consult Status Consult Status: Follow-up Date: 02/15/16 (in pm) Follow-up type: In-patient    Ilse Billman, Diamond NickelLAURA G 02/15/2016, 4:16  AM

## 2016-02-15 NOTE — Lactation Note (Signed)
This note was copied from a baby's chart. Lactation Consultation Note  Patient Name: Girl Casimer LaniusRosalva Campos-Rodriguez MVHQI'OToday's Date: 02/15/2016 Reason for consult: Follow-up assessment Baby at 33 hr of life. Upon entry baby was just coming off the breast. Mom denies breast or nipple pain, voiced no concerns. She reports bf is going well. Discussed baby behavior, feeding frequency, baby belly size, voids, wt loss, breast changes, and nipple care. She is aware of lactation services and support group. She will call at next feeding for lactation to observe a latch.    Maternal Data    Feeding Feeding Type: Breast Fed  LATCH Score/Interventions                      Lactation Tools Discussed/Used     Consult Status Consult Status: Follow-up Date: 02/16/16 Follow-up type: In-patient    Rulon Eisenmengerlizabeth E Gracious Renken 02/15/2016, 9:36 PM

## 2016-02-15 NOTE — Addendum Note (Signed)
Addendum  created 02/15/16 1610 by Rica Records, CRNA   Sign clinical note

## 2016-02-15 NOTE — Progress Notes (Addendum)
Subjective: Postpartum Day #1: Cesarean Delivery Patient reports tolerating PO and no problems voiding; denies dizziness w/ amb or other s/s of anemia; breastfeeding going well; desires Depo for contraception; orthostatic VS neg  Objective: Vital signs in last 24 hours: Temp:  [98.1 F (36.7 C)-99.3 F (37.4 C)] 98.5 F (36.9 C) (10/13 0920) Pulse Rate:  [55-106] 56 (10/13 0920) Resp:  [15-20] 20 (10/13 0920) BP: (91-121)/(42-79) 95/48 (10/13 0920) SpO2:  [93 %-100 %] 99 % (10/13 0920)  Physical Exam:  General: alert, cooperative and no distress Lochia: appropriate Uterine Fundus: firm Incision: pressure dsg intact, dry DVT Evaluation: No evidence of DVT seen on physical exam.   Recent Labs  02/14/16 0342 02/15/16 0529  HGB 9.1* 6.9*  HCT 27.2* 19.8*    Assessment/Plan: Status post Cesarean section. Doing well postoperatively.  Rec blood tx, R&Bs rev'd- pt declines- she will let RN know if she begins to experience s/s anemia and/or changes her mind Will start po Fe for now  Cam HaiSHAW, KIMBERLY CNM 02/15/2016, 1:13 PM

## 2016-02-15 NOTE — Anesthesia Postprocedure Evaluation (Signed)
Anesthesia Post Note  Patient: Katelyn Miller  Procedure(s) Performed: Procedure(s) (LRB): CESAREAN SECTION (N/A)  Patient location during evaluation: Mother Baby Anesthesia Type: Epidural Level of consciousness: awake and alert Pain management: pain level controlled Vital Signs Assessment: post-procedure vital signs reviewed and stable Respiratory status: spontaneous breathing, nonlabored ventilation and respiratory function stable Cardiovascular status: stable Postop Assessment: no headache, no backache, epidural receding and patient able to bend at knees Anesthetic complications: no     Last Vitals:  Vitals:   02/15/16 0540 02/15/16 0542  BP: (!) 92/43 (!) 92/42  Pulse: (!) 56 (!) 58  Resp:    Temp:      Last Pain:  Vitals:   02/15/16 0535  TempSrc: Oral  PainSc: 2    Pain Goal:                 Rica RecordsICKELTON,Shavon Ashmore

## 2016-02-16 DIAGNOSIS — Z98891 History of uterine scar from previous surgery: Secondary | ICD-10-CM

## 2016-02-16 LAB — BIRTH TISSUE RECOVERY COLLECTION (PLACENTA DONATION)

## 2016-02-16 MED ORDER — FERROUS FUMARATE 324 (106 FE) MG PO TABS: 1.0000 | ORAL_TABLET | Freq: Two times a day (BID) | ORAL | 1 refills | Status: DC

## 2016-02-16 MED ORDER — SENNOSIDES-DOCUSATE SODIUM 8.6-50 MG PO TABS: 2.0000 | ORAL_TABLET | Freq: Every day | ORAL | 0 refills | Status: DC

## 2016-02-16 MED ORDER — OXYCODONE HCL 5 MG PO TABS: 5.0000 mg | ORAL_TABLET | Freq: Four times a day (QID) | ORAL | 0 refills | Status: DC | PRN

## 2016-02-16 MED ORDER — IBUPROFEN 600 MG PO TABS: 600.0000 mg | ORAL_TABLET | Freq: Four times a day (QID) | ORAL | 0 refills | Status: DC

## 2016-02-16 NOTE — Discharge Instructions (Signed)
Parto por cesrea - Cuidados posteriores  (Cesarean Delivery, Care After) Siga estas instrucciones durante las prximas semanas. Estas indicaciones le proporcionan informacin general acerca de cmo deber cuidarse despus del procedimiento. El mdico tambin podr darle instrucciones ms especficas. El tratamiento se ha planificado de acuerdo a las prcticas mdicas actuales, pero a veces se producen problemas. Comunquese con el mdico si tiene algn problema o tiene dudas cuando vuelva a su casa.  INSTRUCCIONES PARA EL CUIDADO EN EL HOGAR  Tome slo medicamentos de venta libre o recetados, segn las indicaciones del mdico.  No beba alcohol, especialmente si est amamantando o toma analgsicos.  Nomastique tabaco ni fume.  Contine con un adecuado cuidado perineal. El buen cuidado perineal incluye:  Higienizarse de adelante hacia atrs.  Mantener la zona perineal limpia.  Controlar diariamente el corte (incisin) y observar si aumenta el enrojecimiento, si supura, se hincha o se separa la piel.  Limpie la incisin suavemente con jabn y agua todos los das, y luego squela dando golpecitos. Si el mdico la autoriza, deje la incisin al descubierto. Use un apsito (vendaje) si drena lquido o la incisin parece irritada. Si las pequeas tiras [facility] que cruzan la incisin no se caen dentro de los 7 das, retrelas suavemente.  Abrace una almohada al toser o estornudar hasta que la incisin se cure. Esto ayuda a Best boy.  No conduzca vehculos ni opere maquinarias hasta que el mdico la autorice.  Dchese, lvese el cabello y tome baos de inmersin segn las indicaciones de su mdico.  Utilice un sostn que le ajuste bien y que brinde buen soporte a sus Glass blower/designer.  Limite el uso de bombachas de sostn o medias panty.  Beba suficiente lquido para Consulting civil engineer orina clara o de color amarillo plido.  Consuma todos los das alimentos ricos en fibra como cereales y panes  [place], arroz, frijoles y frutas frescas y verduras. Estos alimentos pueden ayudarla a prevenir o Cytogeneticist.  Reanude las actividades como subir escaleras, conducir automviles, levantar objetos pesados, hacer ejercicios o viajar cuando le indique su mdico.  Hable con su mdico acerca de reanudar la actividad sexual. Volver a la actividad sexual depende del riesgo de infeccin, la velocidad de la curacin y la comodidad y su deseo de Financial controller.  Trate de que alguien la ayude con las actividades del hogar y con el recin nacido al menos durante algunos das despus de salir del hospital.  Descanse todo lo que pueda. Trate de descansar o tomar una siesta mientras el beb est durmiendo.  Aumente sus actividades gradualmente.  Cumpla con todos los controles programados para despus del [place]. Es muy importante asistir a todas las visitas de Nurse, adult. En estas visitas, su mdico va a controlarla para asegurarse de que est sanando fsica y emocionalmente. SOLICITE ATENCIN MDICA SI:   Elimina cogulos grandes por la vagina. Guarde algunos cogulos para mostrarle al mdico.  Tiene una secrecin con feo olor que proviene de la vagina.  Tiene dificultad para orinar.  Orina con frecuencia.  Siente dolor al [employer].  Nota un cambio en sus movimientos intestinales.  Aumenta el enrojecimiento, el dolor o la hinchazn en la zona de la incisin.  Observa que supura pus en la incisin.  La incisin se abre.  Sus [employer] duelen, estn duras o enrojecidas.  Sufre un dolor intenso de [place].  Tiene visin borrosa o ve manchas.  Se siente triste o deprimida.  Tiene pensamientos acerca de lastimarse o daar al  recién nacido. °· Tiene preguntas acerca de su cuidado, la atención del recién nacido o acerca de los medicamentos. °· Se siente mareada o sufre un desmayo. °· Tiene una erupción. °· Siente dolor u observa enrojecimiento o hinchazón en el sitio en que  estaba la vía intravenosa (IV). °· Tiene náuseas o vómitos. °· Usted dejó de amamantar al bebé y no ha tenido su período menstrual dentro de las 12 semanas siguientes. °· No amamanta al bebé y no tuvo su período menstrual en las últimas 12 semanas. °· Tiene fiebre. °SOLICITE ATENCIÓN MÉDICA DE INMEDIATO SI:  °· Siente dolor persistente. °· Siente dolor en el pecho. °· Le falta el aire. °· Se desmaya. °· Siente dolor en la pierna. °· Siente dolor en el estómago. °· El sangrado vaginal satura dos o más apósitos en 1 hora. °ASEGÚRESE DE QUE:  °· Comprende estas instrucciones. °· Controlará su enfermedad. °· Recibirá ayuda de inmediato si no mejora o si empeora. °  °Esta información no tiene como fin reemplazar el consejo del médico. Asegúrese de hacerle al médico cualquier pregunta que tenga. °  °Document Released: 04/21/2005 Document Revised: 05/12/2014 °Elsevier Interactive Patient Education ©2016 Elsevier Inc. ° °

## 2016-02-16 NOTE — Lactation Note (Signed)
This note was copied from a baby's chart. Lactation Consultation Note  Patient Name: Girl Casimer LaniusRosalva Campos-Rodriguez WUJWJ'XToday's Date: 02/16/2016 Reason for consult: Follow-up assessment Inf[facility] is 2752 hours old [facility]. Alex, interpreter helped during consult. Mom has been exclusively BF & reports no pain or discomfort. Baby has lost 6.9% wt at 36 hrs; infant has had many wet & stool diapers. Baby was wrapped up in blanket when LC entered & nurse had just fed baby a little of expressed breastmilk via spoon to try to wake her but she continued to sleep. Discussed importance of BF 8-12 times/24 hours and with feeding cues. Encouraged mom to put baby skin-to-skin often & that when she's wrapped up, sometimes they continue to sleep. Mom understands baby's wt loss & that they're going to check her bilirubin again tonight. Encouraged mom to do as much BF between now & then as she can. Mom agreeable & reports no questions. Encouraged mom to ask for LC at future feeds to assess latch.  Maternal Data    Feeding Feeding Type: Breast Milk Length of feed: 0 min  LATCH Score/Interventions                      Lactation Tools Discussed/Used     Consult Status Consult Status: Follow-up Date: 02/17/16 Follow-up type: In-patient    Oneal GroutLaura C Arsh Feutz 02/16/2016, 5:19 PM

## 2016-02-16 NOTE — Discharge Summary (Signed)
OB Discharge Summary     Patient Name: Katelyn LaniusRosalva Campos-Rodriguez DOB: [birth year] MRN: [id number]  Date of admission: 02/13/2016 Delivering MD: Willodean RosenthalHARRAWAY-SMITH, CAROLYN   Date of discharge: 02/16/2016  Admitting diagnosis: 40 WEEKS PAIN LOWER BACK PAIN LEAKING Intrauterine pregnancy: 5196w2d     Secondary diagnosis:  Active Problems:   Rupture of membranes with clear amniotic fluid   Failed attempted vaginal birth after previous cesarean delivery   Status post repeat low transverse cesarean section  Additional problems: Blood loss anemia, refused blood transfusion     Discharge diagnosis: Term Pregnancy Delivered and Failed VBAC due to NRFHT                                                                                                Post partum procedures:Refused blood transfusion  Augmentation: Pitocin  Complications: None  Hospital course:  Onset of Labor With Unplanned C/S  35 y.o. yo [id number] at 8196w2d was admitted in Active Labor on 02/13/2016. Patient had a labor course significant for failed TOLAC with NRFHT, proceeding with cesarean. Membrane Rupture Time/Date: 11:00 PM ,02/12/2016   The patient went for cesarean section due to Non-Reassuring FHR, and delivered a Viable infant,02/14/2016  Details of operation can be found in separate operative note. Patient had an uncomplicated postpartum course.  She is ambulating,tolerating a regular diet, passing flatus, and urinating well. Denies dizziness.  Patient is discharged home in stable condition 02/16/16.    Physical exam Vitals:   02/15/16 0920 02/15/16 1300 02/15/16 1836 02/16/16 0539  BP: (!) 95/48 (!) 108/40 96/60 (!) 90/50  Pulse: (!) 56 (!) 58 66 (!) 47  Resp: 20 18 18 18   Temp: 98.5 F (36.9 C) 98.2 F (36.8 C) 98.2 F (36.8 C) 97.9 F (36.6 C)  TempSrc:   Oral Oral  SpO2: 99% 98%    Weight:      Height:       General: alert, cooperative and no distress Lochia: appropriate Uterine Fundus: firm Incision:  Healing well with no significant drainage, No significant erythema, Dressing is clean, dry, and intact DVT Evaluation: No evidence of DVT seen on physical exam. Negative Homan's sign. No cords or calf tenderness. Labs: Lab Results  Component Value Date   WBC 9.2 02/15/2016   HGB 6.9 (LL) 02/15/2016   HCT 19.8 (L) 02/15/2016   MCV 79.2 02/15/2016   PLT 204 02/15/2016   No flowsheet data found.  Discharge instruction: per After Visit Summary and "Baby and Me Booklet".  After visit meds:    Medication List    TAKE these medications   Ferrous Fumarate 324 (106 Fe) MG Tabs tablet Commonly known as:  HEMOCYTE - 106 mg FE Take 1 tablet (106 mg of iron total) by mouth 2 (two) times daily.   ibuprofen 600 MG tablet Commonly known as:  ADVIL,MOTRIN Take 1 tablet (600 mg total) by mouth every 6 (six) hours.   oxyCODONE 5 MG immediate release tablet Commonly known as:  Oxy IR/ROXICODONE Take 1 tablet (5 mg total) by mouth every 6 (six) hours as needed for severe pain.   prenatal  multivitamin Tabs tablet Take 1 tablet by mouth daily.   senna-docusate 8.6-50 MG tablet Commonly known as:  Senokot-S Take 2 tablets by mouth at bedtime.       Diet: routine diet  Activity: Advance as tolerated. Pelvic rest for 6 weeks.   Outpatient follow up:2 weeks Follow up Appt:No future appointments. Follow up Visit:No Follow-up on file.  Postpartum contraception: Depo Provera  Newborn Data: Live born female  Birth Weight: 7 lb 13.9 oz (3570 g) APGAR: 8, 9  Baby Feeding: Breast Disposition:home with mother   02/16/2016 Jen Mow, DO

## 2016-02-17 ENCOUNTER — Encounter (HOSPITAL_COMMUNITY): Payer: Self-pay | Admitting: *Deleted

## 2016-02-17 ENCOUNTER — Ambulatory Visit: Payer: Self-pay

## 2016-02-17 NOTE — Discharge Summary (Signed)
OB Discharge Summary                           Patient Name: Katelyn Miller DOB: [birth year] MRN: [id number]  Date of admission: 02/13/2016 Delivering MD: Willodean Rosenthal   Date of discharge: 02/17/2016  ADDENDUM: Patient was supposed to be discharged on 10/14, however the pediatrician team decided to keep the baby for another day for further monitoring. Mom was seen and examined again this morning. She continues to do well. She has been up and walking around without dizziness. Her pain is well controlled. She is safe for discharge home today.  Admitting diagnosis: 40 WEEKS PAIN LOWER BACK PAIN LEAKING Intrauterine pregnancy: [redacted]w[redacted]d     Secondary diagnosis:  Active Problems:   Rupture of membranes with clear amniotic fluid   Failed attempted vaginal birth after previous cesarean delivery   Status post repeat low transverse cesarean section  Additional problems: Blood loss anemia, refused blood transfusion                                      Discharge diagnosis: Term Pregnancy Delivered and Failed VBAC due to NRFHT                                                                                                Post partum procedures:Refused blood transfusion  Augmentation: Pitocin  Complications: None  Hospital course:  Onset of Labor With Unplanned C/S  35 y.o. yo [id number] at [redacted]w[redacted]d was admitted in Active Labor on 02/13/2016. Patient had a labor course significant for failed TOLAC with NRFHT, proceeding with cesarean. Membrane Rupture Time/Date: 11:00 PM ,02/12/2016   The patient went for cesarean section due to Non-Reassuring FHR, and delivered a Viable infant,02/14/2016  Details of operation can be found in separate operative note. Patient had an uncomplicated postpartum course.  She is ambulating,tolerating a regular diet, passing flatus, and urinating well. Denies dizziness.  Patient is discharged home in stable condition 02/17/16.          Physical  exam Vitals:   02/15/16 0920 02/15/16 1300 02/15/16 1836 02/16/16 0539  BP: (!) 95/48 (!) 108/40 96/60 (!) 90/50  Pulse: (!) 56 (!) 58 66 (!) 47  Resp: 20 18 18 18   Temp: 98.5 F (36.9 C) 98.2 F (36.8 C) 98.2 F (36.8 C) 97.9 F (36.6 C)  TempSrc:   Oral Oral  SpO2: 99% 98%    Weight:      Height:       General: alert, cooperative and no distress Lochia: appropriate Uterine Fundus: firm Incision: Healing well with no significant drainage, No significant erythema, Dressing is clean, dry, and intact DVT Evaluation: No evidence of DVT seen on physical exam. Negative Homan's sign. No cords or calf tenderness. Labs: Recent Labs       Lab Results  Component Value Date   WBC 9.2 02/15/2016   HGB 6.9 (LL) 02/15/2016   HCT 19.8 (L) 02/15/2016   MCV 79.2 02/15/2016  PLT 204 02/15/2016     No flowsheet data found.  Discharge instruction: per After Visit Summary and "Baby and Me Booklet".  After visit meds:    Medication List    TAKE these medications   Ferrous Fumarate 324 (106 Fe) MG Tabs tablet Commonly known as:  HEMOCYTE - 106 mg FE Take 1 tablet (106 mg of iron total) by mouth 2 (two) times daily.  ibuprofen 600 MG tablet Commonly known as:  ADVIL,MOTRIN Take 1 tablet (600 mg total) by mouth every 6 (six) hours.  oxyCODONE 5 MG immediate release tablet Commonly known as:  Oxy IR/ROXICODONE Take 1 tablet (5 mg total) by mouth every 6 (six) hours as needed for severe pain.  prenatal multivitamin Tabs tablet Take 1 tablet by mouth daily.  senna-docusate 8.6-50 MG tablet Commonly known as:  Senokot-S Take 2 tablets by mouth at bedtime.      Diet: routine diet  Activity: Advance as tolerated. Pelvic rest for 6 weeks.   Outpatient follow up:2 weeks Follow up Appt:No future appointments. Follow up Visit:No Follow-up on file.  Postpartum contraception: Depo Provera  Newborn Data: Live born female  Birth Weight: 7 lb 13.9 oz  (3570 g) APGAR: 8, 9  Baby Feeding: Breast Disposition:home with mother   Hilton SinclairKaty D Mayo, MD 02/17/16   OB FELLOW DISCHARGE ATTESTATION  I have seen and examined this patient and agree with above documentation in the resident's note.   Jen MowElizabeth Vergie Zahm, DO OB Fellow

## 2016-02-17 NOTE — Progress Notes (Signed)
Post discharge chart review completed.  

## 2016-02-17 NOTE — Lactation Note (Signed)
This note was copied from a baby's chart. Lactation Consultation Note  Patient Name: Katelyn Miller ZOXWR'UToday's Date: 02/17/2016 Reason for consult: Follow-up assessment Baby has been cluster feeding during the night and this morning, not being satisfied at breast. Baby at 9.4% weight loss, but only 2.5% in past 24 hours. 5 voids/5 stools in past 24 hours. [facility], with oral exam, LC noted short lingual frenulum which may account for the chewing. When baby goes to breast she takes few suckles and pops off the breast fussy. She did this for the initial 10 minute feeding and Mom reports some feedings she nurses well and stays on breast and other feedings she pops on/off during the feeding. Worked with positioning to help baby sustain better depth. Demonstrated to Mom how to use breast compression for more depth. After working with Mom/Baby initiated #20 nipple shield to see if baby would sustain the latch better. Baby did not pop on/off the breast but sustained the suckling pattern. Very scant amount of colostrum in nipple shield. Baby acting very hungry still so discussed supplementing baby with EBM/formula till her milk comes in.  Reviewed how to apply/care for nipple shield, hand out given. Hand pump given to post pump to encourage milk production and to have EBM to supplement with feedings. [facility].  Feeding plan for d/c home: BF with feeding ques, at least 8-12 times or more in 24 hours. Keep baby nursing 15-20 minutes both breasts most feedings. Use nipple shield if baby sustains the latch better. Look for breast milk in the nipple shield with feedings.  Post pump for 15 minutes during the day and evening to encourage milk production and to have EBM to supplement. Engorgement care reviewed if needed. Refer to Baby N Me booklet, page 24 Monitor voids/stools. Refer to Baby N Me booklet, page 24. Advised of BM storage guidelines on page 7325,  Baby N Me booklet.  Encouraged to schedule OP f/u - Mom will have to call when she can verify transportation. Wallene HuhMarta, the in-house Spanish interpreter present for visit.  Maternal Data    Feeding Feeding Type: Formula Length of feed: 10 min  LATCH Score/Interventions Latch: Grasps breast easily, tongue down, lips flanged, rhythmical sucking. Intervention(s): Adjust position;Assist with latch;Breast massage  Audible Swallowing: A few with stimulation Intervention(s): Skin to skin  Type of Nipple: Everted at rest and after stimulation  Comfort (Breast/Nipple): Soft / non-tender     Hold (Positioning): Assistance needed to correctly position infant at breast and maintain latch. Intervention(s): Breastfeeding basics reviewed;Support Pillows;Position options;Skin to skin  LATCH Score: 8  Lactation Tools Discussed/Used Tools: Nipple Dorris CarnesShields;Pump Nipple shield size: 20 Breast pump type: Manual   Consult Status Consult Status: Complete Date: 02/17/16 Follow-up type: In-patient    Alfred LevinsGranger, Kincade Granberg Ann 02/17/2016, 1:06 PM

## 2017-06-11 LAB — OB RESULTS CONSOLE GC/CHLAMYDIA
CHLAMYDIA, DNA PROBE: NEGATIVE
GC PROBE AMP, GENITAL: NEGATIVE

## 2017-06-11 LAB — OB RESULTS CONSOLE ABO/RH: RH Type: POSITIVE

## 2017-06-11 LAB — OB RESULTS CONSOLE HEPATITIS B SURFACE ANTIGEN: Hepatitis B Surface Ag: NEGATIVE

## 2017-06-11 LAB — OB RESULTS CONSOLE RUBELLA ANTIBODY, IGM: Rubella: IMMUNE

## 2017-06-11 LAB — OB RESULTS CONSOLE RPR: RPR: NONREACTIVE

## 2017-06-11 LAB — OB RESULTS CONSOLE ANTIBODY SCREEN: Antibody Screen: NEGATIVE

## 2017-06-11 LAB — OB RESULTS CONSOLE HIV ANTIBODY (ROUTINE TESTING): HIV: NONREACTIVE

## 2017-06-15 ENCOUNTER — Other Ambulatory Visit (HOSPITAL_COMMUNITY): Payer: Self-pay | Admitting: Nurse Practitioner

## 2017-06-15 DIAGNOSIS — O09522 Supervision of elderly multigravida, second trimester: Secondary | ICD-10-CM

## 2017-06-15 DIAGNOSIS — Z3689 Encounter for other specified antenatal screening: Secondary | ICD-10-CM

## 2017-06-15 DIAGNOSIS — Z3A19 19 weeks gestation of pregnancy: Secondary | ICD-10-CM

## 2017-07-07 ENCOUNTER — Encounter (HOSPITAL_COMMUNITY): Payer: Self-pay | Admitting: Nurse Practitioner

## 2017-07-13 ENCOUNTER — Encounter (HOSPITAL_COMMUNITY): Payer: Self-pay | Admitting: *Deleted

## 2017-07-15 ENCOUNTER — Ambulatory Visit (HOSPITAL_COMMUNITY): Payer: Commercial Managed Care - PPO

## 2017-07-15 ENCOUNTER — Ambulatory Visit (HOSPITAL_COMMUNITY)
Admission: RE | Admit: 2017-07-15 | Discharge: 2017-07-15 | Disposition: A | Discharge status: Home / Self Care | Payer: Commercial Managed Care - PPO | Source: Ambulatory Visit | Attending: Nurse Practitioner | Admitting: Nurse Practitioner

## 2017-07-15 HISTORY — DX: Unspecified ectopic pregnancy without intrauterine pregnancy: O00.90

## 2017-07-16 ENCOUNTER — Ambulatory Visit (HOSPITAL_COMMUNITY)
Admission: RE | Admit: 2017-07-16 | Discharge: 2017-07-16 | Disposition: A | Discharge status: Home / Self Care | Payer: Self-pay | Source: Ambulatory Visit | Attending: Nurse Practitioner | Admitting: Nurse Practitioner

## 2017-07-16 ENCOUNTER — Other Ambulatory Visit (HOSPITAL_COMMUNITY): Payer: Self-pay | Admitting: Nurse Practitioner

## 2017-07-16 ENCOUNTER — Encounter (HOSPITAL_COMMUNITY): Payer: Self-pay

## 2017-07-16 DIAGNOSIS — O09522 Supervision of elderly multigravida, second trimester: Secondary | ICD-10-CM | POA: Insufficient documentation

## 2017-07-16 DIAGNOSIS — Z3A19 19 weeks gestation of pregnancy: Secondary | ICD-10-CM

## 2017-07-16 DIAGNOSIS — Z3689 Encounter for other specified antenatal screening: Secondary | ICD-10-CM

## 2017-07-16 DIAGNOSIS — Z3A17 17 weeks gestation of pregnancy: Secondary | ICD-10-CM | POA: Insufficient documentation

## 2017-07-16 DIAGNOSIS — O09529 Supervision of elderly multigravida, unspecified trimester: Secondary | ICD-10-CM | POA: Insufficient documentation

## 2017-07-16 NOTE — Progress Notes (Signed)
Genetic Counseling  High-Risk Gestation Note  Appointment Date:  07/16/2017 Referred By: Alberteen Spindlearson, Ashley C, NP Date of Birth:  09/20/1980   Pregnancy History: [id number] Estimated Date of Delivery: 12/20/17 Estimated Gestational Age: 6769w4d Attending: Particia NearingMartha Decker, MD   [name] was seen for genetic counseling because of a maternal age of 37 y.o..  She will be 37 years old at delivery. Language [employer] Spanish/English interpreter was present for today's visit.    In summary:  Discussed AMA and associated risk for fetal aneuploidy  Discussed options for screening  Quad screen- declined  NIPS- declined  Ultrasound- performed today, see separate report  Discussed diagnostic testing options  Amniocentesis- declined  Reviewed family history concerns  She was counseled regarding maternal age and the association with risk for chromosome conditions due to nondisjunction with aging of the ova.   We reviewed chromosomes, nondisjunction, and the associated 1 in 111 risk for fetal aneuploidy at 6569w4d gestation related to a maternal age of 37 years old at deliver.  She was counseled that the risk for aneuploidy decreases as gestational age increases, accounting for those pregnancies which spontaneously abort.  We specifically discussed Down syndrome (trisomy 2521), trisomies 3913 and 4318, and sex chromosome aneuploidies (47,XXX and 47,XXY) including the common features and prognoses of each.   We reviewed available screening options including Quad screen, noninvasive prenatal screening (NIPS)/cell free DNA (cfDNA) screening, and detailed ultrasound.  She was counseled that screening tests are used to modify a patient's a priori risk for aneuploidy, typically based on age. This estimate provides a pregnancy specific risk assessment. We reviewed the benefits and limitations of each option. Specifically, we discussed the conditions for which each test screens, the detection rates, and  false positive rates of each. She was also counseled regarding diagnostic testing via amniocentesis. We reviewed the approximate 1 in 300-500 risk for complications from amniocentesis, including spontaneous pregnancy loss. We discussed the possible results that the tests might provide including: positive, negative, unanticipated, and no result. Finally, they were counseled regarding the cost of each option and potential out of pocket expenses. After consideration of all the options, she elected for ultrasound only and declined Quad screen, NIPS, and amniocentesis.    A complete ultrasound was performed today. The ultrasound report will be sent under separate cover. There were no visualized fetal anomalies or markers suggestive of aneuploidy. Diagnostic testing was declined today.  She understands that screening tests cannot rule out all birth defects or genetic syndromes. The patient was advised of this limitation and states she still does not want additional testing at this time.   Both family histories were reviewed and found to be noncontributory for birth defects, intellectual disability, and known genetic conditions. Consanguinity was denied. Without further information regarding the provided family history, an accurate genetic risk cannot be calculated. Further genetic counseling is warranted if more information is obtained.  [name] denied exposure to environmental toxins or chemical agents. She denied the use of alcohol, tobacco or street drugs. She denied significant viral illnesses during the course of her pregnancy.  I counseled [name] regarding the above risks and available options.  The approximate face-to-face time with the genetic counselor was 35 minutes.  Quinn PlowmanKaren Robertine Kipper, MS,  Certified Genetic Counselor 07/16/2017

## 2017-07-17 ENCOUNTER — Other Ambulatory Visit (HOSPITAL_COMMUNITY): Payer: Self-pay | Admitting: *Deleted

## 2017-07-17 DIAGNOSIS — Z3689 Encounter for other specified antenatal screening: Secondary | ICD-10-CM

## 2017-07-17 DIAGNOSIS — Z362 Encounter for other antenatal screening follow-up: Secondary | ICD-10-CM

## 2017-08-13 ENCOUNTER — Encounter (HOSPITAL_COMMUNITY): Payer: Self-pay

## 2017-08-13 ENCOUNTER — Ambulatory Visit (HOSPITAL_COMMUNITY): Payer: Self-pay

## 2017-11-25 ENCOUNTER — Encounter (HOSPITAL_COMMUNITY): Payer: Self-pay

## 2017-11-27 ENCOUNTER — Telehealth (HOSPITAL_COMMUNITY): Payer: Self-pay | Admitting: *Deleted

## 2017-11-27 NOTE — Telephone Encounter (Signed)
Preadmission screen  

## 2017-11-27 NOTE — Pre-Procedure Instructions (Signed)
Preadmission screen [phone] interpreter number

## 2017-11-30 ENCOUNTER — Telehealth (HOSPITAL_COMMUNITY): Payer: Self-pay | Admitting: *Deleted

## 2017-11-30 NOTE — Pre-Procedure Instructions (Signed)
Health department message left requesting pt to call this RN.

## 2017-11-30 NOTE — Telephone Encounter (Signed)
Preadmission screen Interpreter number [phone]

## 2017-11-30 NOTE — Telephone Encounter (Signed)
Preadmission screen  

## 2017-12-01 ENCOUNTER — Encounter (HOSPITAL_COMMUNITY): Payer: Self-pay

## 2017-12-01 NOTE — Pre-Procedure Instructions (Signed)
Interpreter number [phone]

## 2017-12-11 ENCOUNTER — Encounter (HOSPITAL_COMMUNITY)
Admission: RE | Admit: 2017-12-11 | Discharge: 2017-12-11 | Disposition: A | Discharge status: Home / Self Care | Payer: Medicaid Other | Source: Ambulatory Visit | Attending: Obstetrics and Gynecology | Admitting: Obstetrics and Gynecology

## 2017-12-11 HISTORY — DX: Asymptomatic varicose veins of bilateral lower extremities: I83.93

## 2017-12-11 HISTORY — DX: Supervision of elderly multigravida, unspecified trimester: O09.529

## 2017-12-11 HISTORY — DX: Anemia, unspecified: D64.9

## 2017-12-11 LAB — TYPE AND SCREEN
ABO/RH(D): O POS
ANTIBODY SCREEN: NEGATIVE

## 2017-12-11 LAB — CBC
HEMATOCRIT: 29.3 % — AB (ref 36.0–46.0)
Hemoglobin: 9.8 g/dL — ABNORMAL LOW (ref 12.0–15.0)
MCH: 26.8 pg (ref 26.0–34.0)
MCHC: 33.4 g/dL (ref 30.0–36.0)
MCV: 80.1 fL (ref 78.0–100.0)
PLATELETS: 293 10*3/uL (ref 150–400)
RBC: 3.66 MIL/uL — ABNORMAL LOW (ref 3.87–5.11)
RDW: 14.7 % (ref 11.5–15.5)
WBC: 6.3 10*3/uL (ref 4.0–10.5)

## 2017-12-11 NOTE — Patient Instructions (Signed)
Instrucciones Para Antes de la Ciruga   Su ciruga est programada para 12/14/2017  (your procedure is scheduled on) Entre por la entrada principal del [facility]  a las [address]30 de la [place] -(enter through the main entrance at [facility] [employer] AM    ITT IndustriesLevante el telfono, [place] el [phone] para informarnos de su llegada. (pick up phone, dial [phone] on arrival)     Por favor llame al [phone] si tiene algn problema en la maana de la ciruga (please call this number if you have any problems the morning of surgery.)                  Recuerde: (Remember)  No coma alimentos. (Do not eat food (After Midnight) Desps de medianoche)    No tome lquidos claros. (Do not drink clear liquids (After Midnight) Desps de medianoche)    No use joyas, maquillaje de ojos, lpiz labial, crema para el cuerpo o esmalte de uas oscuro. (Do not wear jewelry, eye makeup, lipstick, body lotion, or dark fingernail polish). Puede usar desodorante (you may wear deodorant)    No se afeite 48 horas de su ciruga. (Do not shave 48 hours before your surgery)    No traiga objetos de valor al hospital.  [place] no se hace responsable de ninguna pertenencia, ni objetos de valor que haya trado al hospital. (Do not bring valuable to the hospital.  [facility] is not responsible for any belongings or valuables brought to the hospital)   [facility] estas medicinas en la maana de la ciruga con un SORBITO de agua nada (take these meds the morning of surgery with a SIP of water)     Durante la ciruga no se pueden usar lentes de contacto, dentaduras o puentes. (Contacts, dentures or bridgework cannot be worn in surgery).   Si va a ser ingresado despus de la ciruga, deje la AMR Corporationmaleta en el carro hasta que se le haya asignado una habitacin. (If you are to be admitted after surgery, leave suitcase in car until your room has been assigned.)   A los pacientes que se les d de alta  el mismo da no se les permitir manejar a casa.  (Patients discharged on the day of surgery will not be allowed to drive home)    [place]bre y nmero de telfono del Programmer, multimediaconductor na. (Name and telephone number of your driver)   Instrucciones especiales N/A (Special Instructions)   Por favor, lea las hojas informativas que le entregaron. (Please read over the following fact sheets that you were given) Surgical Site Infection Prevention

## 2017-12-12 LAB — RPR: RPR: NONREACTIVE

## 2017-12-14 ENCOUNTER — Encounter (HOSPITAL_COMMUNITY)
Admission: RE | Disposition: A | Discharge status: Home / Self Care | Payer: Self-pay | Source: Home / Self Care | Attending: Obstetrics and Gynecology

## 2017-12-14 ENCOUNTER — Inpatient Hospital Stay (HOSPITAL_COMMUNITY): Payer: Medicaid Other | Admitting: Anesthesiology

## 2017-12-14 ENCOUNTER — Inpatient Hospital Stay (HOSPITAL_COMMUNITY)
Admission: RE | Admit: 2017-12-14 | Discharge: 2017-12-16 | DRG: 785 | Disposition: A | Discharge status: Home / Self Care | Payer: Medicaid Other | Attending: Obstetrics and Gynecology | Admitting: Obstetrics and Gynecology

## 2017-12-14 ENCOUNTER — Encounter (HOSPITAL_COMMUNITY): Payer: Self-pay | Admitting: *Deleted

## 2017-12-14 DIAGNOSIS — O34211 Maternal care for low transverse scar from previous cesarean delivery: Secondary | ICD-10-CM | POA: Diagnosis present

## 2017-12-14 DIAGNOSIS — Z3A39 39 weeks gestation of pregnancy: Secondary | ICD-10-CM

## 2017-12-14 DIAGNOSIS — Z302 Encounter for sterilization: Secondary | ICD-10-CM

## 2017-12-14 DIAGNOSIS — O9081 Anemia of the puerperium: Secondary | ICD-10-CM | POA: Diagnosis not present

## 2017-12-14 DIAGNOSIS — D649 Anemia, unspecified: Secondary | ICD-10-CM | POA: Diagnosis not present

## 2017-12-14 DIAGNOSIS — Z9851 Tubal ligation status: Secondary | ICD-10-CM

## 2017-12-14 DIAGNOSIS — Z98891 History of uterine scar from previous surgery: Secondary | ICD-10-CM

## 2017-12-14 DIAGNOSIS — O09529 Supervision of elderly multigravida, unspecified trimester: Secondary | ICD-10-CM

## 2017-12-14 LAB — CBC
HCT: 24.4 % — ABNORMAL LOW (ref 36.0–46.0)
Hemoglobin: 8.1 g/dL — ABNORMAL LOW (ref 12.0–15.0)
MCH: 26.6 pg (ref 26.0–34.0)
MCHC: 33.2 g/dL (ref 30.0–36.0)
MCV: 80 fL (ref 78.0–100.0)
PLATELETS: 259 10*3/uL (ref 150–400)
RBC: 3.05 MIL/uL — AB (ref 3.87–5.11)
RDW: 14.8 % (ref 11.5–15.5)
WBC: 9.3 10*3/uL (ref 4.0–10.5)

## 2017-12-14 SURGERY — Surgical Case
Anesthesia: Spinal | Site: Abdomen | Wound class: Clean Contaminated

## 2017-12-14 MED ORDER — PHENYLEPHRINE 40 MCG/ML (10ML) SYRINGE FOR IV PUSH (FOR BLOOD PRESSURE SUPPORT)
PREFILLED_SYRINGE | INTRAVENOUS | Status: AC
  Filled 2017-12-14: qty 10

## 2017-12-14 MED ORDER — SODIUM CHLORIDE 0.9% FLUSH: 3.0000 mL | INTRAVENOUS | Status: DC | PRN

## 2017-12-14 MED ORDER — OXYTOCIN 40 UNITS IN LACTATED RINGERS INFUSION - SIMPLE MED: 2.5000 [IU]/h | INTRAVENOUS | Status: AC

## 2017-12-14 MED ORDER — PHENYLEPHRINE 8 MG IN D5W 100 ML (0.08MG/ML) PREMIX OPTIME
INJECTION | INTRAVENOUS | Status: DC | PRN
  Administered 2017-12-14: 60 ug/min via INTRAVENOUS

## 2017-12-14 MED ORDER — NALOXONE HCL 4 MG/10ML IJ SOLN
1.0000 ug/kg/h | INTRAVENOUS | Status: DC | PRN
  Filled 2017-12-14: qty 5

## 2017-12-14 MED ORDER — LACTATED RINGERS IV SOLN
INTRAVENOUS | Status: DC
  Administered 2017-12-14 – 2017-12-15 (×3): via INTRAVENOUS

## 2017-12-14 MED ORDER — PRENATAL MULTIVITAMIN CH
1.0000 | ORAL_TABLET | Freq: Every day | ORAL | Status: DC
  Administered 2017-12-15 – 2017-12-16 (×2): 1 via ORAL
  Filled 2017-12-14 (×2): qty 1

## 2017-12-14 MED ORDER — FENTANYL CITRATE (PF) 100 MCG/2ML IJ SOLN
INTRAMUSCULAR | Status: AC
  Filled 2017-12-14: qty 2

## 2017-12-14 MED ORDER — ATROPINE SULFATE 0.4 MG/ML IJ SOLN
INTRAMUSCULAR | Status: DC | PRN
  Administered 2017-12-14: 0.2 mg via INTRAVENOUS

## 2017-12-14 MED ORDER — SCOPOLAMINE 1 MG/3DAYS TD PT72
1.0000 | MEDICATED_PATCH | Freq: Once | TRANSDERMAL | Status: DC
  Administered 2017-12-14: 1.5 mg via TRANSDERMAL

## 2017-12-14 MED ORDER — SIMETHICONE 80 MG PO CHEW
80.0000 mg | CHEWABLE_TABLET | Freq: Three times a day (TID) | ORAL | Status: DC
  Administered 2017-12-15 – 2017-12-16 (×4): 80 mg via ORAL
  Filled 2017-12-14 (×4): qty 1

## 2017-12-14 MED ORDER — NALBUPHINE HCL 10 MG/ML IJ SOLN: 5.0000 mg | INTRAMUSCULAR | Status: DC | PRN

## 2017-12-14 MED ORDER — METOCLOPRAMIDE HCL 5 MG/ML IJ SOLN: 10.0000 mg | Freq: Once | INTRAMUSCULAR | Status: DC | PRN

## 2017-12-14 MED ORDER — COCONUT OIL OIL: 1.0000 "application " | TOPICAL_OIL | Status: DC | PRN

## 2017-12-14 MED ORDER — MEASLES, MUMPS & RUBELLA VAC ~~LOC~~ INJ
0.5000 mL | INJECTION | Freq: Once | SUBCUTANEOUS | Status: DC
  Filled 2017-12-14: qty 0.5

## 2017-12-14 MED ORDER — NALOXONE HCL 0.4 MG/ML IJ SOLN: 0.4000 mg | INTRAMUSCULAR | Status: DC | PRN

## 2017-12-14 MED ORDER — TRANEXAMIC ACID 1000 MG/10ML IV SOLN
1000.0000 mg | INTRAVENOUS | Status: AC
  Administered 2017-12-14: 1000 mg via INTRAVENOUS
  Filled 2017-12-14: qty 10

## 2017-12-14 MED ORDER — SODIUM CHLORIDE 0.9 % IV SOLN
510.0000 mg | Freq: Once | INTRAVENOUS | Status: AC
  Administered 2017-12-14: 510 mg via INTRAVENOUS
  Filled 2017-12-14: qty 17

## 2017-12-14 MED ORDER — IBUPROFEN 800 MG PO TABS
800.0000 mg | ORAL_TABLET | Freq: Three times a day (TID) | ORAL | Status: DC
  Administered 2017-12-14 – 2017-12-16 (×5): 800 mg via ORAL
  Filled 2017-12-14 (×5): qty 1

## 2017-12-14 MED ORDER — ZOLPIDEM TARTRATE 5 MG PO TABS: 5.0000 mg | ORAL_TABLET | Freq: Every evening | ORAL | Status: DC | PRN

## 2017-12-14 MED ORDER — SIMETHICONE 80 MG PO CHEW
80.0000 mg | CHEWABLE_TABLET | ORAL | Status: DC
  Administered 2017-12-14: 80 mg via ORAL
  Filled 2017-12-14 (×2): qty 1

## 2017-12-14 MED ORDER — SCOPOLAMINE 1 MG/3DAYS TD PT72
MEDICATED_PATCH | TRANSDERMAL | Status: AC
  Filled 2017-12-14: qty 1

## 2017-12-14 MED ORDER — SIMETHICONE 80 MG PO CHEW: 80.0000 mg | CHEWABLE_TABLET | ORAL | Status: DC | PRN

## 2017-12-14 MED ORDER — LACTATED RINGERS IV SOLN
INTRAVENOUS | Status: DC | PRN
  Administered 2017-12-14: 10:00:00 via INTRAVENOUS

## 2017-12-14 MED ORDER — MORPHINE SULFATE (PF) 0.5 MG/ML IJ SOLN
INTRAMUSCULAR | Status: AC
  Filled 2017-12-14: qty 10

## 2017-12-14 MED ORDER — OXYTOCIN 10 UNIT/ML IJ SOLN
INTRAMUSCULAR | Status: AC
  Filled 2017-12-14: qty 4

## 2017-12-14 MED ORDER — KETOROLAC TROMETHAMINE 30 MG/ML IJ SOLN: 30.0000 mg | Freq: Four times a day (QID) | INTRAMUSCULAR | Status: AC | PRN

## 2017-12-14 MED ORDER — KETOROLAC TROMETHAMINE 30 MG/ML IJ SOLN
INTRAMUSCULAR | Status: DC | PRN
  Administered 2017-12-14: 30 mg via INTRAVENOUS

## 2017-12-14 MED ORDER — KETOROLAC TROMETHAMINE 30 MG/ML IJ SOLN
INTRAMUSCULAR | Status: AC
  Filled 2017-12-14: qty 1

## 2017-12-14 MED ORDER — MEPERIDINE HCL 25 MG/ML IJ SOLN: 6.2500 mg | INTRAMUSCULAR | Status: DC | PRN

## 2017-12-14 MED ORDER — LACTATED RINGERS IV BOLUS
500.0000 mL | Freq: Once | INTRAVENOUS | Status: AC
  Administered 2017-12-14: 500 mL via INTRAVENOUS

## 2017-12-14 MED ORDER — ACETAMINOPHEN 325 MG PO TABS
650.0000 mg | ORAL_TABLET | ORAL | Status: DC | PRN
Start: 2017-12-14 — End: 2017-12-16
  Administered 2017-12-16 (×2): 650 mg via ORAL
  Filled 2017-12-14 (×2): qty 2

## 2017-12-14 MED ORDER — LACTATED RINGERS IV SOLN: INTRAVENOUS | Status: DC

## 2017-12-14 MED ORDER — OXYTOCIN 10 UNIT/ML IJ SOLN
INTRAVENOUS | Status: DC | PRN
  Administered 2017-12-14: 40 [IU] via INTRAVENOUS

## 2017-12-14 MED ORDER — ONDANSETRON HCL 4 MG/2ML IJ SOLN
INTRAMUSCULAR | Status: AC
  Filled 2017-12-14: qty 2

## 2017-12-14 MED ORDER — ENOXAPARIN SODIUM 40 MG/0.4ML ~~LOC~~ SOLN
40.0000 mg | SUBCUTANEOUS | Status: DC
  Administered 2017-12-15: 40 mg via SUBCUTANEOUS
  Filled 2017-12-14: qty 0.4

## 2017-12-14 MED ORDER — EPHEDRINE 5 MG/ML INJ
INTRAVENOUS | Status: AC
  Filled 2017-12-14: qty 10

## 2017-12-14 MED ORDER — KETOROLAC TROMETHAMINE 30 MG/ML IJ SOLN: 30.0000 mg | Freq: Four times a day (QID) | INTRAMUSCULAR | Status: DC | PRN

## 2017-12-14 MED ORDER — ACETAMINOPHEN 500 MG PO TABS
1000.0000 mg | ORAL_TABLET | Freq: Four times a day (QID) | ORAL | Status: AC
  Administered 2017-12-14 – 2017-12-15 (×3): 1000 mg via ORAL
  Filled 2017-12-14 (×3): qty 2

## 2017-12-14 MED ORDER — OXYCODONE HCL 5 MG PO TABS: 10.0000 mg | ORAL_TABLET | ORAL | Status: DC | PRN

## 2017-12-14 MED ORDER — ONDANSETRON HCL 4 MG/2ML IJ SOLN: 4.0000 mg | Freq: Three times a day (TID) | INTRAMUSCULAR | Status: DC | PRN

## 2017-12-14 MED ORDER — SODIUM CHLORIDE 0.9 % IR SOLN
Status: DC | PRN
  Administered 2017-12-14: 1

## 2017-12-14 MED ORDER — PHENYLEPHRINE 8 MG IN D5W 100 ML (0.08MG/ML) PREMIX OPTIME
INJECTION | INTRAVENOUS | Status: AC
  Filled 2017-12-14: qty 100

## 2017-12-14 MED ORDER — TETANUS-DIPHTH-ACELL PERTUSSIS 5-2.5-18.5 LF-MCG/0.5 IM SUSP: 0.5000 mL | Freq: Once | INTRAMUSCULAR | Status: DC

## 2017-12-14 MED ORDER — NALBUPHINE HCL 10 MG/ML IJ SOLN: 5.0000 mg | Freq: Once | INTRAMUSCULAR | Status: DC | PRN

## 2017-12-14 MED ORDER — DIPHENHYDRAMINE HCL 25 MG PO CAPS: 25.0000 mg | ORAL_CAPSULE | ORAL | Status: DC | PRN

## 2017-12-14 MED ORDER — EPHEDRINE SULFATE-NACL 50-0.9 MG/10ML-% IV SOSY
PREFILLED_SYRINGE | INTRAVENOUS | Status: DC | PRN
  Administered 2017-12-14: 10 mg via INTRAVENOUS

## 2017-12-14 MED ORDER — ATROPINE SULFATE 0.4 MG/ML IJ SOLN
INTRAMUSCULAR | Status: AC
  Filled 2017-12-14: qty 1

## 2017-12-14 MED ORDER — DIBUCAINE 1 % RE OINT: 1.0000 "application " | TOPICAL_OINTMENT | RECTAL | Status: DC | PRN

## 2017-12-14 MED ORDER — OXYCODONE HCL 5 MG PO TABS
5.0000 mg | ORAL_TABLET | ORAL | Status: DC | PRN
  Administered 2017-12-15 – 2017-12-16 (×4): 5 mg via ORAL
  Filled 2017-12-14 (×4): qty 1

## 2017-12-14 MED ORDER — FENTANYL CITRATE (PF) 100 MCG/2ML IJ SOLN: 25.0000 ug | INTRAMUSCULAR | Status: DC | PRN

## 2017-12-14 MED ORDER — CEFAZOLIN SODIUM-DEXTROSE 2-4 GM/100ML-% IV SOLN
INTRAVENOUS | Status: AC
  Filled 2017-12-14: qty 100

## 2017-12-14 MED ORDER — WITCH HAZEL-GLYCERIN EX PADS: 1.0000 "application " | MEDICATED_PAD | CUTANEOUS | Status: DC | PRN

## 2017-12-14 MED ORDER — CEFAZOLIN SODIUM-DEXTROSE 2-4 GM/100ML-% IV SOLN
2.0000 g | INTRAVENOUS | Status: AC
  Administered 2017-12-14: 2 g via INTRAVENOUS
  Filled 2017-12-14: qty 100

## 2017-12-14 MED ORDER — ONDANSETRON HCL 4 MG/2ML IJ SOLN
INTRAMUSCULAR | Status: DC | PRN
  Administered 2017-12-14: 4 mg via INTRAVENOUS

## 2017-12-14 MED ORDER — SOD CITRATE-CITRIC ACID 500-334 MG/5ML PO SOLN
30.0000 mL | ORAL | Status: AC
  Administered 2017-12-14: 30 mL via ORAL
  Filled 2017-12-14: qty 15

## 2017-12-14 MED ORDER — DIPHENHYDRAMINE HCL 25 MG PO CAPS: 25.0000 mg | ORAL_CAPSULE | Freq: Four times a day (QID) | ORAL | Status: DC | PRN

## 2017-12-14 MED ORDER — SENNOSIDES-DOCUSATE SODIUM 8.6-50 MG PO TABS
2.0000 | ORAL_TABLET | ORAL | Status: DC
  Administered 2017-12-14 – 2017-12-16 (×2): 2 via ORAL
  Filled 2017-12-14 (×2): qty 2

## 2017-12-14 MED ORDER — DIPHENHYDRAMINE HCL 50 MG/ML IJ SOLN: 12.5000 mg | INTRAMUSCULAR | Status: DC | PRN

## 2017-12-14 MED ORDER — MENTHOL 3 MG MT LOZG: 1.0000 | LOZENGE | OROMUCOSAL | Status: DC | PRN

## 2017-12-14 MED ORDER — LACTATED RINGERS IV SOLN
INTRAVENOUS | Status: DC
  Administered 2017-12-14 (×2): via INTRAVENOUS

## 2017-12-14 SURGICAL SUPPLY — 42 items
APL SKNCLS STERI-STRIP NONHPOA (GAUZE/BANDAGES/DRESSINGS) ×2
BENZOIN TINCTURE PRP APPL 2/3 (GAUZE/BANDAGES/DRESSINGS) ×4 IMPLANT
CHLORAPREP W/TINT 26ML (MISCELLANEOUS) ×4 IMPLANT
CLAMP CORD UMBIL (MISCELLANEOUS) IMPLANT
CLOSURE STERI STRIP 1/2 X4 (GAUZE/BANDAGES/DRESSINGS) ×3 IMPLANT
CLOTH BEACON ORANGE TIMEOUT ST (SAFETY) ×4 IMPLANT
DRAPE C SECTION CLR SCREEN (DRAPES) IMPLANT
DRSG OPSITE POSTOP 4X10 (GAUZE/BANDAGES/DRESSINGS) ×4 IMPLANT
ELECT REM PT RETURN 9FT ADLT (ELECTROSURGICAL) ×4
ELECTRODE REM PT RTRN 9FT ADLT (ELECTROSURGICAL) ×2 IMPLANT
EXTRACTOR VACUUM M CUP 4 TUBE (SUCTIONS) IMPLANT
EXTRACTOR VACUUM M CUP 4' TUBE (SUCTIONS)
GAUZE SPONGE 4X4 12PLY STRL LF (GAUZE/BANDAGES/DRESSINGS) ×6 IMPLANT
GLOVE BIO SURGEON STRL SZ7.5 (GLOVE) ×4 IMPLANT
GLOVE BIOGEL PI IND STRL 7.0 (GLOVE) ×2 IMPLANT
GLOVE BIOGEL PI INDICATOR 7.0 (GLOVE) ×2
GOWN STRL REUS W/TWL 2XL LVL3 (GOWN DISPOSABLE) ×4 IMPLANT
GOWN STRL REUS W/TWL LRG LVL3 (GOWN DISPOSABLE) ×8 IMPLANT
KIT ABG SYR 3ML LUER SLIP (SYRINGE) IMPLANT
NDL HYPO 25X5/8 SAFETYGLIDE (NEEDLE) IMPLANT
NEEDLE HYPO 22GX1.5 SAFETY (NEEDLE) ×4 IMPLANT
NEEDLE HYPO 25X5/8 SAFETYGLIDE (NEEDLE) IMPLANT
NS IRRIG 1000ML POUR BTL (IV SOLUTION) ×4 IMPLANT
PACK C SECTION WH (CUSTOM PROCEDURE TRAY) ×4 IMPLANT
PAD ABD 7.5X8 STRL (GAUZE/BANDAGES/DRESSINGS) ×3 IMPLANT
PAD OB MATERNITY 4.3X12.25 (PERSONAL CARE ITEMS) ×4 IMPLANT
PENCIL SMOKE EVAC W/HOLSTER (ELECTROSURGICAL) ×4 IMPLANT
RTRCTR C-SECT PINK 25CM LRG (MISCELLANEOUS) ×4 IMPLANT
STRIP CLOSURE SKIN 1/2X4 (GAUZE/BANDAGES/DRESSINGS) ×3 IMPLANT
SUT CHROMIC 1 CTX 36 (SUTURE) ×8 IMPLANT
SUT VIC AB 1 CT1 36 (SUTURE) ×8 IMPLANT
SUT VIC AB 2-0 CT1 (SUTURE) ×4 IMPLANT
SUT VIC AB 2-0 CT1 27 (SUTURE) ×4
SUT VIC AB 2-0 CT1 TAPERPNT 27 (SUTURE) ×2 IMPLANT
SUT VIC AB 3-0 CT1 27 (SUTURE) ×8
SUT VIC AB 3-0 CT1 TAPERPNT 27 (SUTURE) ×4 IMPLANT
SUT VIC AB 3-0 SH 27 (SUTURE)
SUT VIC AB 3-0 SH 27X BRD (SUTURE) IMPLANT
SUT VIC AB 4-0 KS 27 (SUTURE) ×4 IMPLANT
SYR BULB IRRIGATION 50ML (SYRINGE) IMPLANT
TOWEL OR 17X24 6PK STRL BLUE (TOWEL DISPOSABLE) ×4 IMPLANT
TRAY FOLEY W/BAG SLVR 14FR LF (SET/KITS/TRAYS/PACK) ×4 IMPLANT

## 2017-12-14 NOTE — Progress Notes (Signed)
Patient is reporting that she is feeling dizzy:  Her bp has been trending down into the 80's systolic and her heart rate is ranging from 44 to 49.  MD called; orders received for stat CBC and 500 cc bolus.  Will continue to monitor.   Vivi MartensAshley Kylee Umana RN

## 2017-12-14 NOTE — H&P (Signed)
Katelyn Miller is a 37 y.o. [id number] IUP 39 1/7 weeks presenting for repeat c section and BTL. Pt reports good FM. Denies VB or LOF. [facility], unremarkable except for AMA. Pt desires tubal ligatation. H/O right salpingectomy over 10 yrs ago.   OB History    Gravida  6   Para  4   Term  4   Preterm      AB  1   Living  4     SAB      TAB      Ectopic  1   Multiple  0   Live Births  4          Past Medical History:  Diagnosis Date  . AMA (advanced maternal age) multigravida 35+   . Anemia   . Ectopic pregnancy   . Varicose veins of both lower extremities    Past Surgical History:  Procedure Laterality Date  . CESAREAN SECTION    . CESAREAN SECTION N/A 02/14/2016   Procedure: CESAREAN SECTION;  Surgeon: Willodean Rosenthalarolyn Harraway-Smith, MD;  [facility];  Service: Obstetrics;  Laterality: N/A;  . right salpingectomy     Family History: family history is not on file. Social History:  reports that she has never smoked. She has never used smokeless tobacco. She reports that she does not drink alcohol or use drugs.     Maternal Diabetes: No Maternal Ultrasounds/Referrals: Normal Fetal Ultrasounds or other Referrals:  None Maternal Substance Abuse:  No Significant Maternal Medications:  None Significant Maternal Lab Results:  None Other Comments:  None  Review of Systems  Constitutional: Negative.   Respiratory: Negative.   Cardiovascular: Negative.   Gastrointestinal: Negative.   Genitourinary: Negative.   Musculoskeletal: Negative.    History   Blood pressure 127/70, pulse 72, temperature 98.4 F (36.9 C), temperature source Oral, resp. rate 18, height 5\' 3"  (1.6 m), weight 70.4 kg, last menstrual period 03/03/2017, unknown if currently breastfeeding. Exam Physical Exam  Constitutional: She appears well-developed and well-nourished.  Cardiovascular: Normal rate and regular rhythm.  Respiratory: Effort normal and  breath sounds normal.  GI: Soft. Bowel sounds are normal.  Gravid Sizes = dates  Genitourinary:  Genitourinary Comments: deferred  Musculoskeletal:  Trace edema    Prenatal labs: ABO, Rh: --/--/O POS (08/09 1055) Antibody: NEG (08/09 1055) Rubella: Immune (02/07 0000) RPR: Non Reactive (08/09 1055)  HBsAg: Negative (02/07 0000)  HIV: Non-reactive (02/07 0000)  GBS:     Assessment/Plan: IUP 39 1/7 weeks Prior c section desire for repeat Desire for tubal ligatation AMA Language barrier  Interrupter used during explanation of c section and tubal. R/B/post op care and failure rate of tubal reviewed with pt. Pt verbalized understanding and desires to proceed.    Hermina StaggersMichael L Amayrany Cafaro 12/14/2017, 9:31 AM

## 2017-12-14 NOTE — Progress Notes (Signed)
RN called Dr. Truddie Cocoell to notify her of orthostatic vitals of 90/49, 86/49, 81/45 and that pt states that she is slightly dizzy when stood up but states symptoms have improved a little.  Dr. Truddie Cocoell stated to push oral fluids and to continue monitoring and will re-evaluate in morning and that foley can stay until pt is less symptomatic.

## 2017-12-14 NOTE — Transfer of Care (Signed)
Immediate Anesthesia Transfer of Care Note  Patient: Katelyn Miller  Procedure(s) Performed: CESAREAN SECTION WITH BILATERAL TUBAL LIGATION (N/A Abdomen)  Patient Location: PACUPACU  Anesthesia Type:Spinal  Level of Consciousness: awake and patient cooperative  Airway & Oxygen Therapy: Patient Spontanous Breathing  Post-op Assessment: Report given to RN and Post -op Vital signs reviewed and stable  Post vital signs: Reviewed and stable  Last Vitals:  Vitals Value Taken Time  BP 111/56 12/14/2017 11:10 AM  Temp    Pulse 62 12/14/2017 11:12 AM  Resp 15 12/14/2017 11:12 AM  SpO2 100 % 12/14/2017 11:12 AM  Vitals shown include unvalidated device data.  Last Pain:  Vitals:   12/14/17 0759  TempSrc: Oral  PainSc: 0-No pain      Patients Stated Pain Goal: 4 (12/14/17 0759)  Complications: No apparent anesthesia complications

## 2017-12-14 NOTE — Anesthesia Preprocedure Evaluation (Signed)
Anesthesia Evaluation  Patient identified by MRN, date of birth, ID band Patient awake    Reviewed: Allergy & Precautions, H&P , NPO status , Patient's Chart, lab work & pertinent test results, reviewed documented beta blocker date and time   History of Anesthesia Complications Negative for: history of anesthetic complications  Airway Mallampati: II  TM Distance: >3 FB Neck ROM: full    Dental no notable dental hx. (+) Teeth Intact   Pulmonary neg pulmonary ROS,    Pulmonary exam normal breath sounds clear to auscultation       Cardiovascular Exercise Tolerance: Good negative cardio ROS Normal cardiovascular exam Rhythm:regular Rate:Normal     Neuro/Psych negative neurological ROS  negative psych ROS   GI/Hepatic negative GI ROS, Neg liver ROS,   Endo/Other  negative endocrine ROS  Renal/GU negative Renal ROS  negative genitourinary   Musculoskeletal   Abdominal   Peds  Hematology negative hematology ROS (+)   Anesthesia Other Findings   Reproductive/Obstetrics negative OB ROS (+) Pregnancy                             Anesthesia Physical  Anesthesia Plan  ASA: II  Anesthesia Plan: Spinal   Post-op Pain Management:    Induction:   PONV Risk Score and Plan: 2 and Ondansetron, Treatment may vary due to age or medical condition and Scopolamine patch - Pre-op  Airway Management Planned: Natural Airway  Additional Equipment:   Intra-op Plan:   Post-operative Plan:   Informed Consent: I have reviewed the patients History and Physical, chart, labs and discussed the procedure including the risks, benefits and alternatives for the proposed anesthesia with the patient or authorized representative who has indicated his/her understanding and acceptance.   Dental Advisory Given  Plan Discussed with: CRNA  Anesthesia Plan Comments:         Anesthesia Quick Evaluation

## 2017-12-14 NOTE — Op Note (Signed)
Operative Report  PATIENT: Katelyn Miller  PROCEDURE DATE: 12/14/2017  PREOPERATIVE DIAGNOSES: Intrauterine pregnancy at 4272w1d weeks gestation; patient declines vag del attempt;  Undesired fertility  POSTOPERATIVE DIAGNOSES: The same  PROCEDURE: Repeat Low Transverse Cesarean Section; Bilateral Tubal Sterilization using Filshie clips  SURGEON:   Surgeon(s) and Role:    * Hermina StaggersErvin, Michael L, MD - Primary    * Arvilla MarketWallace, Mykiah Schmuck Lauren, DO - Fellow - Attending     INDICATIONS: Katelyn Miller is a 37 y.o. [id number] at 4272w1d here for cesarean section secondary to the indications listed under preoperative diagnoses; please see preoperative note for further details.  The risks of cesarean section were discussed with the patient including but were not limited to: bleeding which may require transfusion or reoperation; infection which may require antibiotics; injury to bowel, bladder, ureters or other surrounding organs; injury to the fetus; need for additional procedures including hysterectomy in the event of a life-threatening hemorrhage; placental abnormalities wth subsequent pregnancies, incisional problems, thromboembolic phenomenon and other postoperative/anesthesia complications.   The patient concurred with the proposed plan, giving informed written consent for the procedure.    FINDINGS:  Viable female infant in cephalic presentation.  Apgars 8 and 8.  Clear amniotic fluid.  Intact placenta, three vessel cord.  Normal uterus, fallopian tubes and ovaries bilaterally.  ANESTHESIA: Spinal INTRAVENOUS FLUIDS:2600 mL  ESTIMATED BLOOD LOSS: 955 mL URINE OUTPUT:  650 ml SPECIMENS: Placenta sent to pathology COMPLICATIONS: None immediate  PROCEDURE IN DETAIL:  The patient preoperatively received intravenous antibiotics and had sequential compression devices applied to her lower extremities.  She was then taken to the operating room where spinal anesthesia was administered and was  found to be adequate. She was then placed in a dorsal supine position with a leftward tilt, and prepped and draped in a sterile manner.  A foley catheter was placed into her bladder and attached to constant gravity.    After an adequate timeout was performed, a Pfannenstiel skin incision was made with scalpel over her preexisting scar and carried through to the underlying layer of fascia. The fascia was incised in the midline, and this incision was extended bilaterally using the Mayo scissors.  Kocher clamps were applied to the superior aspect of the fascial incision and the underlying rectus muscles were dissected off bluntly.  A similar process was carried out on the inferior aspect of the fascial incision. The rectus muscles were separated in the midline bluntly and the peritoneum was entered bluntly. Attention was turned to the lower uterine segment where a low transverse hysterotomy was made with a scalpel and extended bilaterally bluntly.  The infant was successfully delivered, the cord was clamped and cut after one minute, and the infant was handed over to the awaiting neonatology team. Uterine massage was then administered, and the placenta delivered intact with a three-vessel cord. The uterus was then cleared of clots and debris.  The hysterotomy was closed with 0 Vicryl in a running locked fashion Figure-of-eight 0 Vicryl serosal stitches were placed to help with hemostasis.  Attention was then turned to the fallopian tubes. Confirmed that patient was s/p right salpingectomy. Attention was turned to the left fallopian tube. The Babcock clamp was then used to grasp the tube approximately 4 cm from the cornual region. A 3 cm segment of the tube was then ligated with free tie of plain gut suture, transected and excised. Good hemostasis was noted and the tube was returned to the abdomen. The pelvis was cleared of all  clot and debris. Hemostasis was confirmed on all surfaces.  The peritoneum was closed with a  0 Vicryl running stitch and the rectus muscles were reapproximated using 0 Vicryl running stitches. The fascia was then closed using 0 Vicryl.  The subcutaneous layer was irrigated, then reapproximated with 2-0 plain gut interrupted stitches. The skin was closed with a 4-0 Vicryl subcuticular stitch.   The patient tolerated the procedure well. Sponge, lap, instrument and needle counts were correct x 3.  She was taken to the recovery room in stable condition.     Disposition: PACU - hemodynamically stable.   Maternal Condition: stable   Marcy Sirenatherine Valeria Boza, D.O. OB Fellow  12/14/2017, 11:16 AM

## 2017-12-15 ENCOUNTER — Encounter (HOSPITAL_COMMUNITY): Payer: Self-pay | Admitting: Advanced Practice Midwife

## 2017-12-15 DIAGNOSIS — D649 Anemia, unspecified: Secondary | ICD-10-CM | POA: Diagnosis not present

## 2017-12-15 LAB — CBC
HEMATOCRIT: 21.4 % — AB (ref 36.0–46.0)
Hemoglobin: 7.1 g/dL — ABNORMAL LOW (ref 12.0–15.0)
MCH: 26.8 pg (ref 26.0–34.0)
MCHC: 33.2 g/dL (ref 30.0–36.0)
MCV: 80.8 fL (ref 78.0–100.0)
PLATELETS: 252 10*3/uL (ref 150–400)
RBC: 2.65 MIL/uL — ABNORMAL LOW (ref 3.87–5.11)
RDW: 15 % (ref 11.5–15.5)
WBC: 7.4 10*3/uL (ref 4.0–10.5)

## 2017-12-15 LAB — CREATININE, SERUM
CREATININE: 0.52 mg/dL (ref 0.44–1.00)
GFR calc non Af Amer: >60 mL/min (ref >60)

## 2017-12-15 LAB — BIRTH TISSUE RECOVERY COLLECTION (PLACENTA DONATION)

## 2017-12-15 NOTE — Anesthesia Postprocedure Evaluation (Signed)
Anesthesia Post Note  Patient: Mieka Campos-Rodriguez  Procedure(s) Performed: CESAREAN SECTION WITH BILATERAL TUBAL LIGATION (N/A Abdomen)     Patient location during evaluation: Mother Baby Anesthesia Type: Spinal Level of consciousness: awake and alert and oriented Pain management: satisfactory to patient Vital Signs Assessment: post-procedure vital signs reviewed and stable Respiratory status: spontaneous breathing and nonlabored ventilation Cardiovascular status: stable Postop Assessment: no headache, no backache, patient able to bend at knees, no signs of nausea or vomiting and adequate PO intake Anesthetic complications: no    Last Vitals:  Vitals:   12/15/17 0320 12/15/17 0553  BP:  (!) 93/47  Pulse:  (!) 52  Resp:  18  Temp:  36.9 C  SpO2: 95% 99%    Last Pain:  Vitals:   12/15/17 0553  TempSrc:   PainSc: 0-No pain   Pain Goal: Patients Stated Pain Goal: 4 (12/14/17 0759)               Madison HickmanGREGORY,Shayra Anton

## 2017-12-15 NOTE — Lactation Note (Addendum)
This note was copied from a baby's chart. Lactation Consultation Note:  Spanish Interpreter Eda at the bedside for all teaching.  Mother is a P5 and reports that she breastfed all her children for one year.  Mother was given Spanish Lactation brochure and informed of all services.   Assist mother with latching infant on in football hold on the left breast.  Infant sustained latch for 20 mins while I observed.   Mother reminded to do hand expression and breast compression.  Advised mother to breastfeed infant at least 8-12 times in 24 hours Discussed cue base and cluster feeding. Encouraged to do STS.  Mother receptive to all teaching.  Mother informed of all [facility],  Including BFSG'S, outpatient services ,  phone line for 24/7 for breastfeeding questions and concerns .  Patient Name: Katelyn Casimer LaniusRosalva Campos-Rodriguez OZHYQ'MToday's Date: 12/15/2017 Reason for consult: Initial assessment   Maternal Data    Feeding Feeding Type: Breast Fed Length of feed: 20 min  LATCH Score Latch: Grasps breast easily, tongue down, lips flanged, rhythmical sucking.  Audible Swallowing: Spontaneous and intermittent  Type of Nipple: Everted at rest and after stimulation  Comfort (Breast/Nipple): Soft / non-tender  Hold (Positioning): Assistance needed to correctly position infant at breast and maintain latch.  LATCH Score: 9  Interventions Interventions: Breast feeding basics reviewed;Assisted with latch;Skin to skin;Hand express;Breast compression;Adjust position;Support pillows;Position options;Expressed milk  Lactation Tools Discussed/Used     Consult Status Consult Status: Follow-up Date: 12/16/17 Follow-up type: In-patient    Stevan BornKendrick, Tomma Ehinger [facility] 12/15/2017, 2:32 PM

## 2017-12-15 NOTE — Progress Notes (Signed)
Patient ID: Katelyn LaniusRosalva Miller, female   DOB: 1981/03/24, 37 y.o.   MRN: [id number] POSTPARTUM PROGRESS NOTE  Post Operative Day 1 Subjective:  Katelyn Miller is a 37 y.o. [id number] 862w1d s/p rLTCS/BTL.  Complained of dizziness upon standing overnight. Was given 500cc bolus and started on 125ml maintenance in early morning.  This morning denied dizziness upon standing. Pt denies problems with ambulating, voiding or po intake.  She denies nausea or vomiting.  Pain is well controlled.  She has had flatus. She has not had bowel movement.  Lochia Small.   Objective: Blood pressure (!) 93/47, pulse (!) 52, temperature 98.5 F (36.9 C), resp. rate 18, height 5\' 3"  (1.6 m), weight 70.4 kg, last menstrual period 03/03/2017, SpO2 99 %, unknown if currently breastfeeding.  Physical Exam:  General: alert, cooperative and no distress Chest: no respiratory distress Heart:regular rate, distal pulses intact Incision: no significant drainage.  Abdomen: soft, nontender,  Uterine Fundus: firm, appropriately tender DVT Evaluation: No calf swelling or tenderness Extremities: no edema  Recent Labs    12/14/17 1827 12/15/17 0537  HGB 8.1* 7.1*  HCT 24.4* 21.4*    Assessment/Plan:  ASSESSMENT: Katelyn Miller is a 37 y.o. [id number] 552w1d s/p rLTCS/BTL. Has a current Hgb of 7.1.  Is receiving PO iron supplementation. Complaints of dizziness upon standing have resolved.   Plan for discharge tomorrow   LOS: 1 day   Jackelyn KnifeDaniel K OlsonMD 12/15/2017, 12:43 PM

## 2017-12-16 DIAGNOSIS — Z9851 Tubal ligation status: Secondary | ICD-10-CM

## 2017-12-16 MED ORDER — OXYCODONE HCL 5 MG PO TABS: 5.0000 mg | ORAL_TABLET | Freq: Four times a day (QID) | ORAL | 0 refills | Status: DC | PRN

## 2017-12-16 MED ORDER — IBUPROFEN 800 MG PO TABS: 800.0000 mg | ORAL_TABLET | Freq: Four times a day (QID) | ORAL | 0 refills | Status: DC | PRN

## 2017-12-16 MED ORDER — SENNOSIDES-DOCUSATE SODIUM 8.6-50 MG PO TABS: 2.0000 | ORAL_TABLET | Freq: Every evening | ORAL | 0 refills | Status: DC | PRN

## 2017-12-16 NOTE — Lactation Note (Signed)
This note was copied from a baby's chart. Lactation Consultation Note  Patient Name: Katelyn Casimer LaniusRosalva Campos-Rodriguez WUJWJ'XToday's Date: 12/16/2017 Reason for consult: Follow-up assessment;Infant weight loss;Term(9% weight loss, [employer] interpreter [phone]- #[id number] via Ipad / Serum bili 8.5 / milk is in both breast )  Baby is 2454 hours old LC updated the doc flow sheet per mom  Per mom baby recently breast for 10 mins/ and milk is in.  [facility] to check her breast to make sure she wasn't engorged.  Breast full, full nodules noted, but not engorged bilaterally. Nipples healthy , no breakdown.  LC recommended feeding baby long enough on  the 1st breast to soften well, and also  \while feeding breast compressions and massage. If breast are to full to start to release The full ness, and latch the baby. LC discussed the importance of STS feedings until  The baby can stay awake for a feeding, nutritive vs non - nutritive feeding patterns, and the  Importance of watching the baby for hanging out latched.  LC instructed mom on the use hand pump and check the flange #24 F is a good fit and mom  Able to return demo.  Mother informed of post-discharge support and given phone number to the lactation department, including services for phone call assistance; out-patient appointments; and breastfeeding support group. List of other breastfeeding resources in the community given in the handout. Encouraged mother to call for problems or concerns related to breastfeeding.  Per mom active with [place] [facility] .    Maternal Data Has patient been taught Hand Expression?: Yes(LC reviewed and mom able to repeat )  Feeding Feeding Type: (pe rmom baby recently fed for 10 mins ) Length of feed: 10 min(per mom )  LATCH Score                   Interventions Interventions: Breast feeding basics reviewed  Lactation Tools Discussed/Used Tools: Pump Breast pump type: Manual WIC Program:  Yes Pump Review: Setup, frequency, and cleaning;Milk Storage Initiated by:: MAI  Date initiated:: 12/16/17   Consult Status Consult Status: Complete Date: 12/16/17    Matilde SprangMargaret Ann Arilyn Brierley 12/16/2017, 4:30 PM

## 2017-12-16 NOTE — Progress Notes (Signed)
Katelyn LaniusRosalva Miller is a 37 y.o. [id number] at POD #2 Scheduled RLTCS  Addendum to above discharge note. SUBJECTIVE: Patient denies orthostatic symptoms, scant vaginal bleeding.   OBJECTIVE: H/H 7.1/21.4 on 12/15/17, baseline H/H  9.8/29.3 on 12/11/17 Orthostatic vitals reviewed> no significant changes  Fundus firm, mildly tender, at [id number].  Incision with honeycomb dressing, scant dried blood   ASSESSMENT: POD#2 RLTCS and unilateral Tubal ligation (Patient is S/P right salpingectomy)  PLAN: Will message WOC for 1 wk incision check.  Katelyn Miller used for Spanish interpretation.  Katelyn Miller, Katelyn Miller, CNM 12/16/2017 2:44 PM

## 2017-12-16 NOTE — Discharge Summary (Signed)
OB Discharge Summary     Patient Name: Katelyn LaniusRosalva Miller DOB: 1981/02/13 MRN: [id number]  Date of admission: 12/14/2017 Delivering MD: Hermina StaggersERVIN, MICHAEL L   Date of discharge: 12/16/2017  Admitting diagnosis: RCS Intrauterine pregnancy: 7318w1d     Secondary diagnosis:  Active Problems:   Status post repeat low transverse cesarean section   Advanced maternal age in multigravida   Anemia   S/P tubal ligation      Discharge diagnosis: Term Pregnancy Delivered                                                                                                Post partum procedures:Feraheme  Augmentation: None  Complications: None  Hospital course:  Sceduled C/S   37 y.o. yo [id number] at 4818w1d was admitted to the hospital 12/14/2017 for scheduled cesarean section with the following indication:Elective Repeat.  Membrane Rupture Time/Date: 10:08 AM ,12/14/2017   Patient delivered a Viable infant.12/14/2017  Details of operation can be found in separate operative note.  Pateint had an uncomplicated postpartum course.  She is ambulating, tolerating a regular diet, passing flatus, and urinating well. Patient is discharged home in stable condition on  12/16/17         Physical exam  Vitals:   12/15/17 0553 12/15/17 1503 12/15/17 2340 12/16/17 0550  BP: (!) 93/47 (!) 101/57 (!) 105/56 (!) 92/43  Pulse: (!) 52 (!) 50 (!) 56 (!) 54  Resp: 18 16 17 17   Temp: 98.5 F (36.9 C) 98.2 F (36.8 C) 98.5 F (36.9 C) 98.9 F (37.2 C)  TempSrc:  Oral Oral Oral  SpO2: 99% 99%    Weight:      Height:       General: alert, cooperative and no distress Lochia: appropriate Uterine Fundus: firm Incision: Healing well with no significant drainage, No significant erythema, Dressing is clean, dry, and intact DVT Evaluation: No evidence of DVT seen on physical exam. Labs: Lab Results  Component Value Date   WBC 7.4 12/15/2017   HGB 7.1 (L) 12/15/2017   HCT 21.4 (L) 12/15/2017   MCV 80.8 12/15/2017    PLT 252 12/15/2017   CMP Latest Ref Rng & Units 12/15/2017  Creatinine 0.44 - 1.00 mg/dL 1.470.52    Discharge instruction: per After Visit Summary and "Baby and Me Booklet".  After visit meds:  Allergies as of 12/16/2017   No Known Allergies     Medication List    STOP taking these medications   acetaminophen 500 MG tablet Commonly known as:  TYLENOL     TAKE these medications   ibuprofen 800 MG tablet Commonly known as:  ADVIL,MOTRIN Take 1 tablet (800 mg total) by mouth every 6 (six) hours as needed (for pain).   oxyCODONE 5 MG immediate release tablet Commonly known as:  Oxy IR/ROXICODONE Take 1 tablet (5 mg total) by mouth every 6 (six) hours as needed (for pain not relieved by ibuprofen).   prenatal multivitamin Tabs tablet Take 1 tablet by mouth daily.   senna-docusate 8.6-50 MG tablet Commonly known as:  Senokot-S Take 2 tablets by mouth at  bedtime as needed (for constipation).       Diet: routine diet  Activity: Advance as tolerated. Pelvic rest for 6 weeks.   Outpatient follow up:4 weeks Follow up Appt:No future appointments. Follow up Visit:No follow-ups on file.  Postpartum contraception: Tubal Ligation  Newborn Data: Live born female  Birth Weight: 6 lb 15.3 oz (3155 g) APGAR: 8, 8  Newborn Delivery   Birth date/time:  12/14/2017 10:08:00 Delivery type:  C-Section, Low Transverse Trial of labor:  No C-section categorization:  Repeat     Baby Feeding: Breast Disposition:home with mother   12/16/2017 Candis SchatzPatricia Navin Dogan, MD

## 2017-12-16 NOTE — Discharge Instructions (Signed)
Parto por cesariana, cuidados aps o procedimento Cesarean Delivery, Care After Consulte este folheto nas prximas semanas. Essas instrues fornecem informaes gerais para os cuidados aps o seu procedimento. Seu mdico tambm poder fornecer instrues mais especficas. Seu tratamento foi planejado de acordo com as prticas mdicas atuais, mas s vezes problemas podem ocorrer. Ligue para seu mdico se tiver algum problema ou dvida aps o procedimento. O que posso esperar aps o procedimento? Aps seu procedimento,  comum que ocorram:  Uma pequena quantidade de sangue ou lquido lmpido saindo da inciso.  Alguma vermelhido, inchao ou dor na rea da inciso.  Alguma irritao e dor abdominais.  [place] vaginal (lquios).  Clicas plvicas.  Fadiga.  Siga essas instrues em casa: BaristaComo cuidar da inciso   Siga as instrues do seu mdico sobre como Herbalistcuidar da inciso. Certifique-se de: ? Lavar as mos com gua e sabo antes de trocar sua atadura (curativo). Caso gua e sabo no estejam disponveis, use gel antissptico para as mos. ? Troque o curativo como indicado pelo seu mdico. ? No remova pontos (suturas), grampos, cola cirrgica ou faixas adesivas de uso dermatolgico. Pode ser necessrio deixar esses fechamentos de ferida no lugar por 2 semanas ou [place]. Caso as [place] das faixas adesivas comecem a se soltar ou dobrar, voc pode cortar as extremidades soltas. No remova faixas adesivas completamente a menos que seu mdico lhe diga para fazer isso.  Verifique a Programmer, systemsrea da inciso todos os dias em busca de sinais de infeco. Verifique a ocorrncia de: ? Aumento da vermelhido, do Film/video editorinchao ou da dor. ? Aumento da quantidade de lquido ou sangue. ? Calor. ? Pus ou mau cheiro.  Quando tossir ou Psychologist, educationalespirrar, abrace Rohm and Haasum travesseiro. Isso ajuda com a dor e diminui a chance de sua inciso se abrir (deiscncia). Faa isso at Marathon Oilque sua inciso cicatrize. Medicamentos  [place]  medicamentos vendidos com ou sem receita mdica somente de acordo com as indicaes do seu mdico.  Caso tenha recebido uma prescrio de antibitico, tome-o somente como determinado pelo seu mdico. No pare de tomar o antibitico at General Motorsele ter terminado. Direo  No dirija nem opere mquinas pesadas enquanto estiver tomando analgsicos vendidos com receita mdica.  No dirija por 24 horas se tiver recebido Continental Airlinesum sedativo. Estilo de vida  No beba lcool. Isso  especialmente importante caso voc esteja amamentando ou tomando analgsicos.  No use derivados do tabaco, incluindo cigarros, tabaco de mascar ou cigarros eletrnicos. Caso precise de ajuda para parar de fumar, fale com seu mdico. O tabaco pode retardar a cicatrizao da inciso. Alimentos e bebidas  Beba pelo menos oito copos de 8 onas de gua todos os dias a menos que seu mdico diga o contrrio. Caso esteja amamentando, voc poder precisar beber CIT Groupainda mais gua.  Consuma alimentos com elevado teor de fibras todos os dias. Esses alimentos podero ajudar a Agricultural consultantprevenir ou aliviar a constipao. Alimentos ricos em fibras incluem: ? Cereais e pes integrais. ? Arroz integral. ? Feijes. ? Frutas e legumes frescos. Atividades  Retorne s suas atividades normais de acordo com as orientaes do seu mdico. Pergunte ao seu mdico quais atividades so seguras para voc.  Repouse o mximo possvel. Tente descansar ou tirar uma soneca quando seu beb estiver dormindo.  No levante nada mais pesado que seu beb ou mais de 10 libras (4,5 kg) conforme orientado pelo seu mdico.  Pergunte ao seu mdico sobre quando voc poder ter relaes sexuais. Isso pode depender de: ? Risco de infeces. ? Taxa de cicatrizao. ?  Conforto e desejo de Education officer, environmentalrealizar atividade sexual. Banho  No tome banhos de banheira, nade, nem use hidromassagem at Owens & Minorobter aprovao do seu mdico. Pergunte ao seu mdico se voc pode tomar banho de chuveiro. Pode ser que  voc possa tomar banho apenas com esponja at Marathon Oilque sua inciso cicatrize.  Mantenha a ferida seca conforme as orientaes do seu mdico. Instrues gerais  No use absorventes internos nem faa duchas vaginais at que seu mdico libere.  Roupas: ? Use roupas confortveis e largas. ? Use um suti de tamanho adequado e com suporte.  Fique atenta para cogulos que possam ser expelidos pela sua vagina. Esses cogulos podem se parecer com acmulos de secreo de cor vermelha escura, marrom ou preta.  Mantenha seu perneo limpo e seco de acordo com as orientaes do seu mdico.  Limpe-se com movimentos da frente para trs aps usar o vaso sanitrio.  Se possvel, pea a algum para ajudar voc com o beb e nas tarefas domsticas por pelo menos alguns dias depois que voc deixar o hospital.  Comparea a todas as consultas de acompanhamento suas e do beb de acordo com as orientaes do seu mdico. Isso  importante. Entre em contato com um mdico se:  Voc apresentar: ? Secreo vaginal com cheiro ruim. ? Dificuldade para urinar. ? Dor ao urinar. ? Aumento sbito na Walgreenfrequncia das suas defecaes. ? Piora na vermelhido, inchao ou dor Pensions consultantao redor da inciso. ? Piora na secreo ou Financial plannersangue da inciso. ? Pus ou mau cheiro vindo do local da inciso. ? Febre. ? Uma erupo cutnea. ? Pouco ou nenhum interesse nas atividades de que voc [place]ostava. ? Dvidas sobre como cuidar de voc mesma ou do beb. ? Enjoo.  A inciso parecer quente ao toque.  Suas mamas ficarem avermelhadas, doloridas ou duras.  Voc se sentir incomumente triste ou preocupada.  Vomitar.  Voc expelir grandes cogulos de sangue pela vagina. Se expelir um grande cogulo, guarde-o para mostrar ao mdico. No elimine os cogulos no vaso sanitrio sem mostrar para seu mdico.  Urinar mais do que o normal.  Sentir tontura ou vertigem.  No tiver amamentado nenhuma vez e no tiver menstruado por 12 semanas aps o  parto.  Tiver parado de amamentar e no tiver SPX Corporationmenstruado por 12 semanas aps parar de Copywriter, advertisingamamentar. Tomasa Hosebtenha ajuda imediatamente se:  Voc apresentar: ? Dor que no desaparece ou no melhora com medicamentos. ? Dor no peito. ? Dificuldade para respirar. ? Viso embaada ou manchas na viso. ? Pensar em fazer mal a si mesma ou ao seu beb. ? Surgirem dores no abdmen ou em [address] Grant Roaduma das pernas. ? Dor de cabea intensa.  Desmaiar.  Sangrar muito pela vagina a ponto de usar dois absorventes em uma hora. Estas informaes no se destinam a substituir as recomendaes de seu mdico. No deixe de discutir quaisquer dvidas com seu mdico. Document Released: 08/13/2015 Document Revised: 08/13/2016 Document Reviewed: 03/26/2015 Elsevier Interactive Patient Education  2018 [employer]

## 2017-12-16 NOTE — Discharge Summary (Deleted)
Post Op Day 1  Subjective: no complaints, up ad lib, voiding, tolerating PO and + flatus  Objective: Blood pressure (!) 92/43, pulse (!) 54, temperature 98.9 F (37.2 C), temperature source Oral, resp. rate 17, height 5\' 3"  (1.6 m), weight 70.4 kg, last menstrual period 03/03/2017, SpO2 99 %, unknown if currently breastfeeding.  Physical Exam:  General: alert and cooperative Lochia: appropriate Uterine Fundus: firm Incision: healing well, no significant drainage, no dehiscence, no significant erythema DVT Evaluation: No evidence of DVT seen on physical exam.  Recent Labs    12/14/17 1827 12/15/17 0537  HGB 8.1* 7.1*  HCT 24.4* 21.4*    Assessment/Plan: Plan for discharge tomorrow, Breastfeeding and Contraception s/p BTL   LOS: 2 days   Katelyn Miller 12/16/2017, 7:52 AM

## 2018-01-04 ENCOUNTER — Encounter (HOSPITAL_COMMUNITY): Payer: Self-pay

## 2018-01-04 ENCOUNTER — Emergency Department (HOSPITAL_COMMUNITY): Payer: Self-pay

## 2018-01-04 ENCOUNTER — Emergency Department (HOSPITAL_COMMUNITY)
Admission: EM | Admit: 2018-01-04 | Discharge: 2018-01-04 | Disposition: A | Discharge status: Home / Self Care | Payer: Self-pay | Attending: Emergency Medicine | Admitting: Emergency Medicine

## 2018-01-04 ENCOUNTER — Other Ambulatory Visit: Payer: Self-pay

## 2018-01-04 DIAGNOSIS — E876 Hypokalemia: Secondary | ICD-10-CM | POA: Insufficient documentation

## 2018-01-04 DIAGNOSIS — Z79899 Other long term (current) drug therapy: Secondary | ICD-10-CM | POA: Insufficient documentation

## 2018-01-04 DIAGNOSIS — N12 Tubulo-interstitial nephritis, not specified as acute or chronic: Secondary | ICD-10-CM | POA: Insufficient documentation

## 2018-01-04 LAB — CBC WITH DIFFERENTIAL/PLATELET
Abs Immature Granulocytes: 0.1 10*3/uL (ref 0.0–0.1)
Basophils Absolute: 0 10*3/uL (ref 0.0–0.1)
Basophils Relative: 0 %
Eosinophils Absolute: 0 10*3/uL (ref 0.0–0.7)
Eosinophils Relative: 0 %
HCT: 35 % — ABNORMAL LOW (ref 36.0–46.0)
Hemoglobin: 11 g/dL — ABNORMAL LOW (ref 12.0–15.0)
Immature Granulocytes: 1 %
Lymphocytes Relative: 18 %
Lymphs Abs: 2 10*3/uL (ref 0.7–4.0)
MCH: 26.6 pg (ref 26.0–34.0)
MCHC: 31.4 g/dL (ref 30.0–36.0)
MCV: 84.7 fL (ref 78.0–100.0)
Monocytes Absolute: 1.3 10*3/uL — ABNORMAL HIGH (ref 0.1–1.0)
Monocytes Relative: 12 %
Neutro Abs: 7.6 10*3/uL (ref 1.7–7.7)
Neutrophils Relative %: 69 %
Platelets: 317 10*3/uL (ref 150–400)
RBC: 4.13 MIL/uL (ref 3.87–5.11)
RDW: 18.6 % — ABNORMAL HIGH (ref 11.5–15.5)
WBC: 11 10*3/uL — ABNORMAL HIGH (ref 4.0–10.5)

## 2018-01-04 LAB — URINALYSIS, ROUTINE W REFLEX MICROSCOPIC
Bilirubin Urine: NEGATIVE
Glucose, UA: NEGATIVE mg/dL
Ketones, ur: 20 mg/dL — AB
Nitrite: NEGATIVE
Protein, ur: NEGATIVE mg/dL
Specific Gravity, Urine: 1.009 (ref 1.005–1.030)
WBC, UA: >50 WBC/hpf — ABNORMAL HIGH (ref 0–5)
pH: 6 (ref 5.0–8.0)

## 2018-01-04 LAB — COMPREHENSIVE METABOLIC PANEL
ALT: 30 U/L (ref 0–44)
AST: 24 U/L (ref 15–41)
Albumin: 3.1 g/dL — ABNORMAL LOW (ref 3.5–5.0)
Alkaline Phosphatase: 181 U/L — ABNORMAL HIGH (ref 38–126)
Anion gap: 11 (ref 5–15)
BUN: 6 mg/dL (ref 6–20)
CO2: 24 mmol/L (ref 22–32)
Calcium: 8.8 mg/dL — ABNORMAL LOW (ref 8.9–10.3)
Chloride: 102 mmol/L (ref 98–111)
Creatinine, Ser: 0.62 mg/dL (ref 0.44–1.00)
GFR calc Af Amer: >60 mL/min (ref >60)
GFR calc non Af Amer: >60 mL/min (ref >60)
Glucose, Bld: 112 mg/dL — ABNORMAL HIGH (ref 70–99)
Potassium: 2.7 mmol/L — CL (ref 3.5–5.1)
Sodium: 137 mmol/L (ref 135–145)
Total Bilirubin: 1 mg/dL (ref 0.3–1.2)
Total Protein: 6.6 g/dL (ref 6.5–8.1)

## 2018-01-04 LAB — I-STAT CG4 LACTIC ACID, ED: Lactic Acid, Venous: 0.65 mmol/L (ref 0.5–1.9)

## 2018-01-04 MED ORDER — SODIUM CHLORIDE 0.9 % IV SOLN
1.0000 g | Freq: Once | INTRAVENOUS | Status: AC
  Administered 2018-01-04: 1 g via INTRAVENOUS
  Filled 2018-01-04: qty 10

## 2018-01-04 MED ORDER — POTASSIUM CHLORIDE CRYS ER 20 MEQ PO TBCR
60.0000 meq | EXTENDED_RELEASE_TABLET | Freq: Once | ORAL | Status: AC
  Administered 2018-01-04: 60 meq via ORAL
  Filled 2018-01-04: qty 3

## 2018-01-04 MED ORDER — CEPHALEXIN 500 MG PO CAPS: 500.0000 mg | ORAL_CAPSULE | Freq: Three times a day (TID) | ORAL | 0 refills | Status: AC

## 2018-01-04 MED ORDER — SODIUM CHLORIDE 0.9 % IV BOLUS
1000.0000 mL | Freq: Once | INTRAVENOUS | Status: AC
  Administered 2018-01-04: 1000 mL via INTRAVENOUS

## 2018-01-04 NOTE — Discharge Instructions (Addendum)
Continue with Motrin and Tylenol as needed as directed for fever. Take Keflex as prescribed and complete the full course. Follow up with your doctor this week, return to the ER for any worsening or concerning symptoms.

## 2018-01-04 NOTE — ED Notes (Signed)
Patient verbalizes understanding of discharge instructions. Opportunity for questioning and answers were provided. Armband removed by staff, pt discharged from ED.  

## 2018-01-04 NOTE — ED Provider Notes (Addendum)
[facility] EMERGENCY DEPARTMENT Provider Note   CSN: [id number] Arrival date & time: 01/04/18  1021     History   Chief Complaint No chief complaint on file.   HPI Katelyn Miller is a 37 y.o. female.  37yo female presents with complaint of fever and chills.  Patient states that she is 20 days post C-section, took her antibiotics as prescribed and completed the course on Wednesday.  Patient states last night she started to feel very hot to the touch with chills, checked her temperature and reports fever of 104 axillary.  Patient states she last had Tylenol at 6 AM today.  Patient states that this time she is feeling well, no abdominal pain, no back pain, denies changes in bowel or bladder habits.  No sick contacts at home, states that her C-section is healing well, no drainage, no pain.  Patient is breast-feeding, denies pain in her breast or redness.  Patient states yesterday when she had the fever she had a minor headache as well as slight discomfort in her right flank, these have resolved with the Tylenol.  No other complaints or concerns.     Past Medical History:  Diagnosis Date  . AMA (advanced maternal age) multigravida 35+   . Anemia   . Ectopic pregnancy   . Failed attempted vaginal birth after previous cesarean delivery 02/16/2016  . Varicose veins of both lower extremities     Patient Active Problem List   Diagnosis Date Noted  . S/P tubal ligation 12/16/2017  . Anemia 12/15/2017  . Advanced maternal age in multigravida 07/16/2017  . Status post repeat low transverse cesarean section 02/16/2016    Past Surgical History:  Procedure Laterality Date  . CESAREAN SECTION    . CESAREAN SECTION N/A 02/14/2016   Procedure: CESAREAN SECTION;  Surgeon: Willodean Rosenthal, MD;  Location: [facility] BIRTHING SUITES;  Service: Obstetrics;  Laterality: N/A;  . CESAREAN SECTION WITH BILATERAL TUBAL LIGATION N/A 12/14/2017   Procedure: CESAREAN SECTION  WITH BILATERAL TUBAL LIGATION;  Surgeon: Hermina Staggers, MD;  Location: [facility] BIRTHING SUITES;  Service: Obstetrics;  Laterality: N/A;  . right salpingectomy       OB History    Gravida  6   Para  5   Term  5   Preterm      AB  1   Living  5     SAB      TAB      Ectopic  1   Multiple  0   Live Births  5            Home Medications    Prior to Admission medications   Medication Sig Start Date End Date Taking? Authorizing Provider  acetaminophen (TYLENOL) 500 MG tablet Take 500 mg by mouth every 6 (six) hours as needed for mild pain.   Yes [provider]  Prenatal Vit-Fe Fumarate-FA (PRENATAL MULTIVITAMIN) TABS Take 1 tablet by mouth daily.    Yes [provider]  senna-docusate (SENOKOT-S) 8.6-50 MG tablet Take 2 tablets by mouth at bedtime as needed (for constipation). 12/16/17  Yes Candis Schatz, MD  cephALEXin (KEFLEX) 500 MG capsule Take 1 capsule (500 mg total) by mouth 3 (three) times daily for 10 days. 01/04/18 01/14/18  Jeannie Fend, PA-C    Family History No family history on file.  Social History Social History   Tobacco Use  . Smoking status: Never Smoker  . Smokeless tobacco: Never Used  Substance Use  Topics  . Alcohol use: No  . Drug use: No     Allergies   Patient has no known allergies.   Review of Systems Review of Systems  Constitutional: Positive for chills, diaphoresis and fever.  Respiratory: Negative for shortness of breath.   Cardiovascular: Negative for chest pain.  Gastrointestinal: Negative for abdominal distention, abdominal pain, constipation, diarrhea, nausea and vomiting.  Genitourinary: Positive for flank pain. Negative for dysuria, frequency and urgency.  Musculoskeletal: Negative for arthralgias and myalgias.  Skin: Negative for rash and wound.  Allergic/Immunologic: Negative for immunocompromised state.  Neurological: Positive for headaches. Negative for weakness.  Hematological: Negative for  adenopathy. Does not bruise/bleed easily.  Psychiatric/Behavioral: Negative for confusion.  All other systems reviewed and are negative.    Physical Exam Updated Vital Signs BP 118/66   Pulse 77   Temp 98.8 F (37.1 C) (Oral)   Resp (!) 21   SpO2 100%   Physical Exam  Constitutional: She is oriented to person, place, and time. She appears well-developed and well-nourished. No distress.  HENT:  Head: Normocephalic and atraumatic.  Cardiovascular: Normal rate, regular rhythm, normal heart sounds and intact distal pulses.  No murmur heard. Pulmonary/Chest: Effort normal and breath sounds normal. No respiratory distress.  Abdominal: Soft. She exhibits no distension. There is no tenderness. There is no CVA tenderness.  Healing c-section incision without signs of infection  Musculoskeletal: She exhibits no tenderness.  Neurological: She is alert and oriented to person, place, and time.  Skin: Skin is warm and dry. She is not diaphoretic.  Psychiatric: She has a normal mood and affect. Her behavior is normal.  Nursing note and vitals reviewed.    ED Treatments / Results  Labs (all labs ordered are listed, but only abnormal results are displayed) Labs Reviewed  COMPREHENSIVE METABOLIC PANEL - Abnormal; Notable for the following components:      Result Value   Potassium 2.7 (*)    Glucose, Bld 112 (*)    Calcium 8.8 (*)    Albumin 3.1 (*)    Alkaline Phosphatase 181 (*)    All other components within normal limits  CBC WITH DIFFERENTIAL/PLATELET - Abnormal; Notable for the following components:   WBC 11.0 (*)    Hemoglobin 11.0 (*)    HCT 35.0 (*)    RDW 18.6 (*)    Monocytes Absolute 1.3 (*)    All other components within normal limits  URINALYSIS, ROUTINE W REFLEX MICROSCOPIC - Abnormal; Notable for the following components:   Hgb urine dipstick LARGE (*)    Ketones, ur 20 (*)    Leukocytes, UA LARGE (*)    WBC, UA >50 (*)    Bacteria, UA RARE (*)    All other  components within normal limits  I-STAT CG4 LACTIC ACID, ED    EKG None  Radiology No results found.  Procedures Procedures (including critical care time)  Medications Ordered in ED Medications  sodium chloride 0.9 % bolus 1,000 mL (0 mLs Intravenous Stopped 01/04/18 1321)  cefTRIAXone (ROCEPHIN) 1 g in sodium chloride 0.9 % 100 mL IVPB (0 g Intravenous Stopped 01/04/18 1321)  potassium chloride SA (K-DUR,KLOR-CON) CR tablet 60 mEq (60 mEq Oral Given 01/04/18 1153)     Initial Impression / Assessment and Plan / ED Course  I have reviewed the triage vital signs and the nursing notes.  Pertinent labs & imaging results that were available during my care of the patient were reviewed by me and considered in my  medical decision making (see chart for details).  Clinical Course as of Jan 15 1952  Mon Jan 04, 2018  1355 37yo female with complaint of fever, max temp 104 last night, reports right flank pain with the fever last night, no pain today. Patient is 20days post c-section, completed her pain medications last Wednesday (thought she was on antibiotics, rx report shows IBU and oxycodone). On exam, well-appearing female, C-section site without evidence of infection, no dehiscence, no erythema, no induration, no drainage.  Urinalysis with large amount of blood, 20 ketones, leukocytes, white cells and rare bacteria, CBC with very mild leukocytosis at 11,000, lactic acid normal, CMP with potassium of 2.7.  Patient was given potassium by mouth while in the emergency room, also given Rocephin for possible urinary tract infection.  CT urogram ordered for further evaluation of possible kidney stone versus pyelonephritis, done without contrast as patient is breast-feeding.  CT report shows right perinephric stranding without hydronephrosis or obstructing stone.  Consider pyelonephritis versus passed stone.  Patient has remained afebrile while in the emergency room, feels well and is ready for discharge home.   Patient be discharged home with prescription for Keflex, advised to take Motrin Tylenol for her fever and follow-up with her OB this week, return to the ER for any worsening or concerning symptoms.   [LM]    Clinical Course User Index [LM] Jeannie Fend, PA-C    Final Clinical Impressions(s) / ED Diagnoses   Final diagnoses:  Pyelonephritis  Hypokalemia    ED Discharge Orders         Ordered    cephALEXin (KEFLEX) 500 MG capsule  3 times daily     01/04/18 1344           Alden Hipp 01/04/18 1358    Raeford Razor, MD 01/05/18 0730    Jeannie Fend, PA-C 01/14/18 1953    Raeford Razor, MD 01/15/18 1339

## 2018-01-04 NOTE — ED Notes (Signed)
Critical potassium 2.7  Will make EDP aware

## 2018-01-04 NOTE — ED Notes (Signed)
Patient placed on cardiac monitoring.

## 2018-01-04 NOTE — ED Notes (Addendum)
Error in charting.

## 2018-01-04 NOTE — ED Triage Notes (Signed)
Patient complains of dark urine and fever intermittently since c-section 20 days ago. Has finished antibiotics and no drainage nor pain at incision. Yesterday increased chills and temp 104 with back pain

## 2019-03-23 IMAGING — CT CT RENAL STONE PROTOCOL
2 of 3 series · 16 of 46 positions shown, 18 images · non-contrast
Comparison: None.

CLINICAL DATA: Fever and abnormally colored urine since a cesarean
section 12/14/2017. Right lower quadrant pain.

EXAM:
CT ABDOMEN AND PELVIS WITHOUT CONTRAST
TECHNIQUE: Multidetector CT imaging of the abdomen and pelvis was performed
following the standard protocol without IV contrast.

[Series 3: stone study 5.0 i30f 2 · axial · 0.77mm/px · z∈[+858,+1268]mm · 13 of 96 slices shown, 15 images]
[im 7/96  soft-tissue]
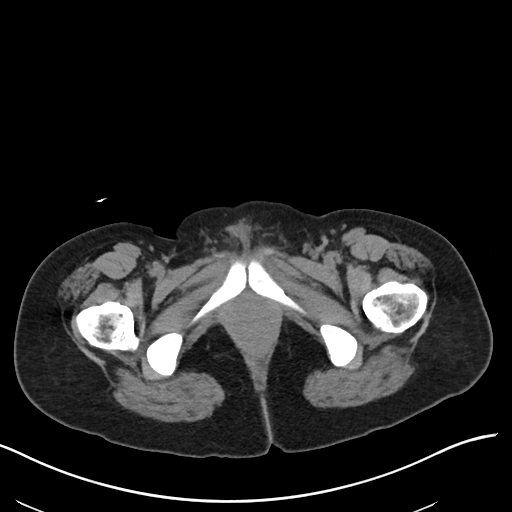
[im 7/96  bone]
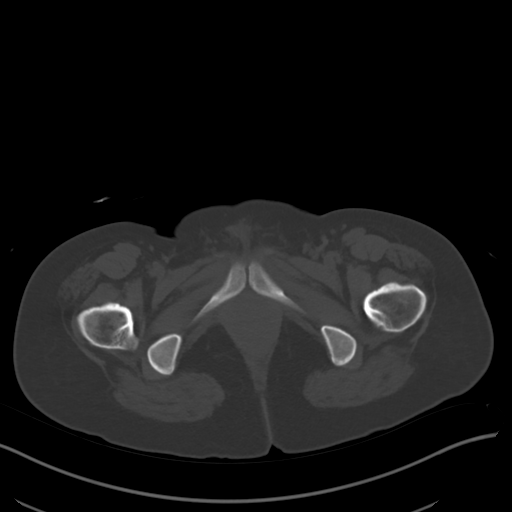
[im 13/96  soft-tissue]
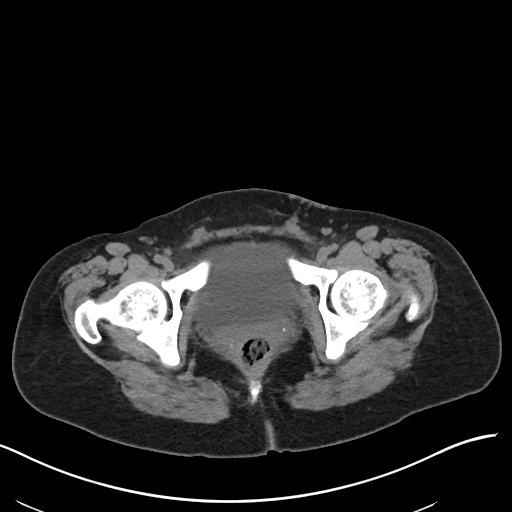
[im 19/96  soft-tissue]
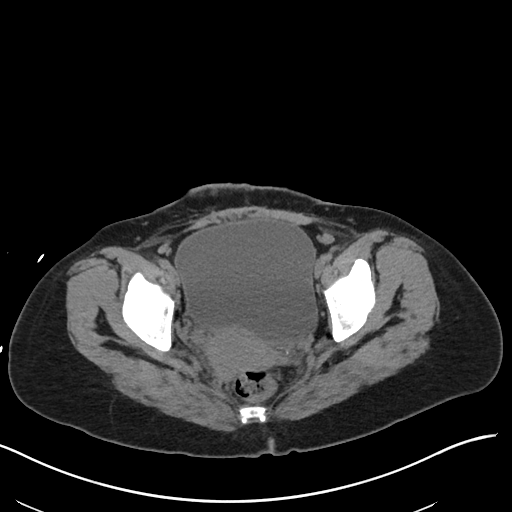
[im 28/96  soft-tissue]
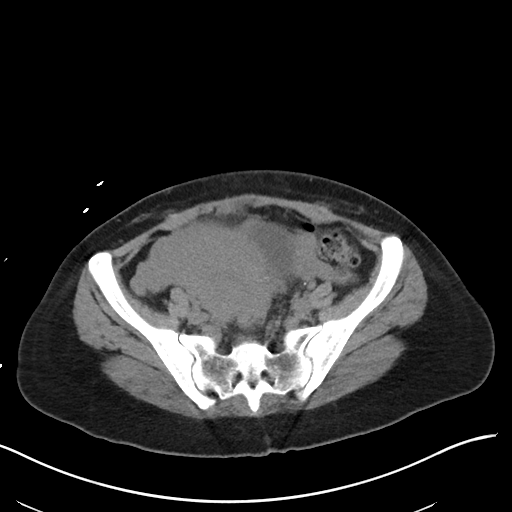
[im 34/96  soft-tissue]
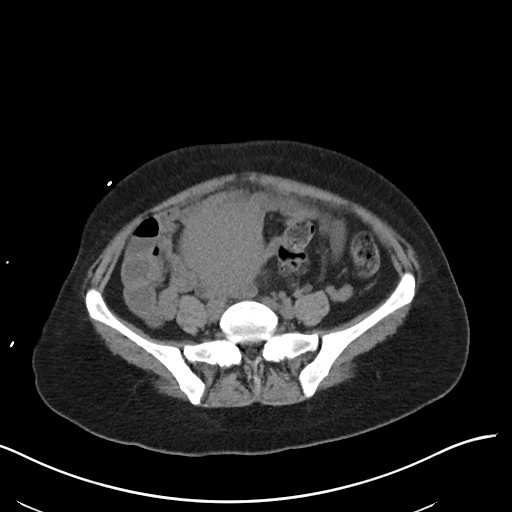
[im 40/96  soft-tissue]
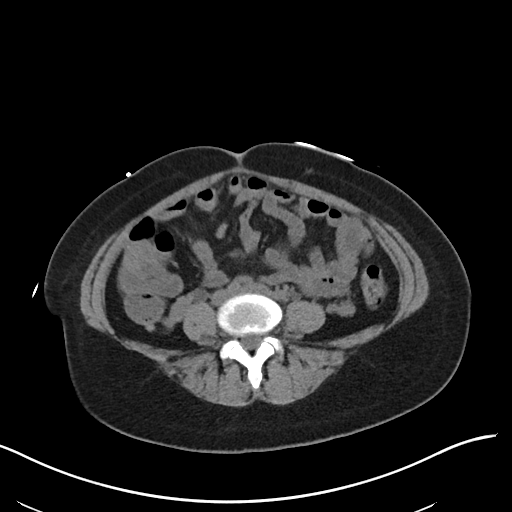
[im 50/96  soft-tissue]
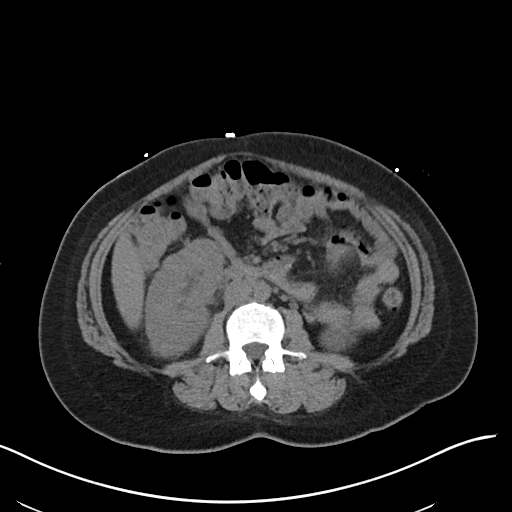
[im 56/96  soft-tissue]
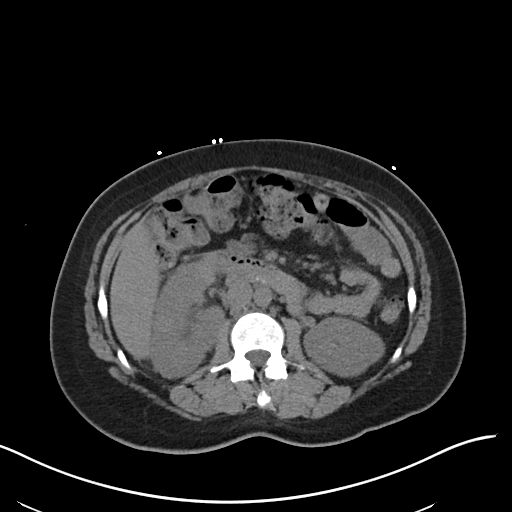
[im 62/96  soft-tissue]
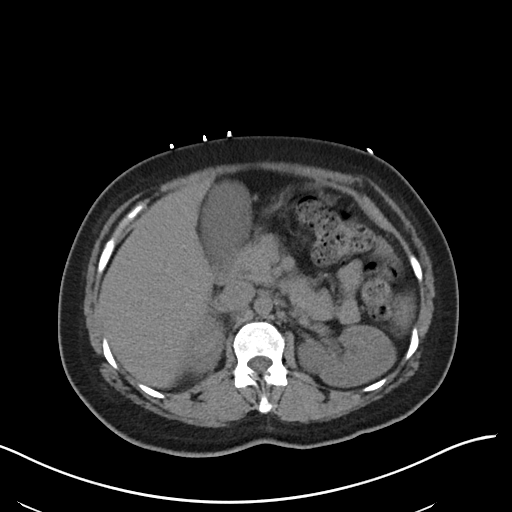
[im 62/96  bone]
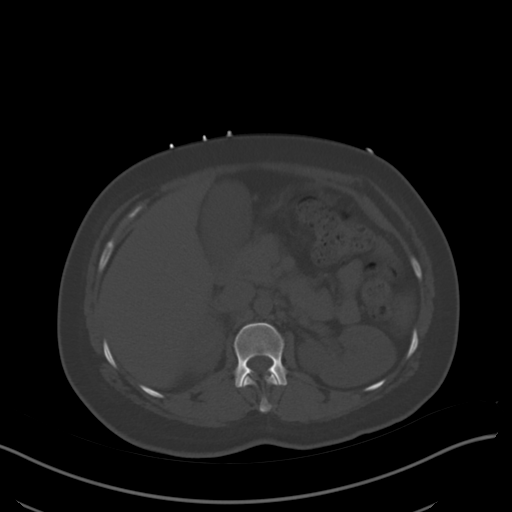
[im 68/96  soft-tissue]
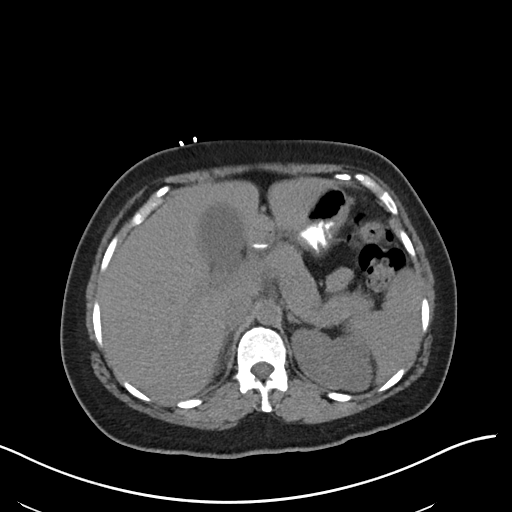
[im 77/96  soft-tissue]
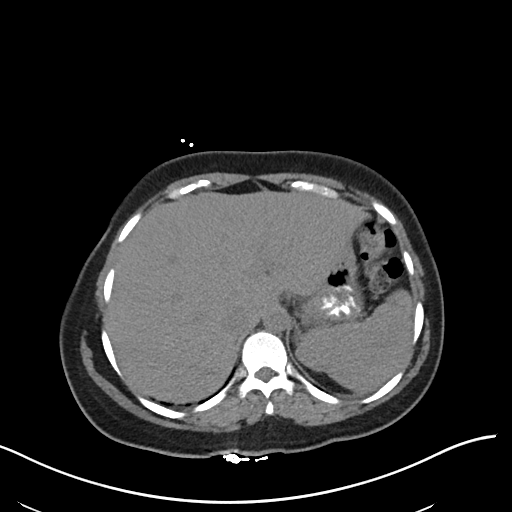
[im 83/96  soft-tissue]
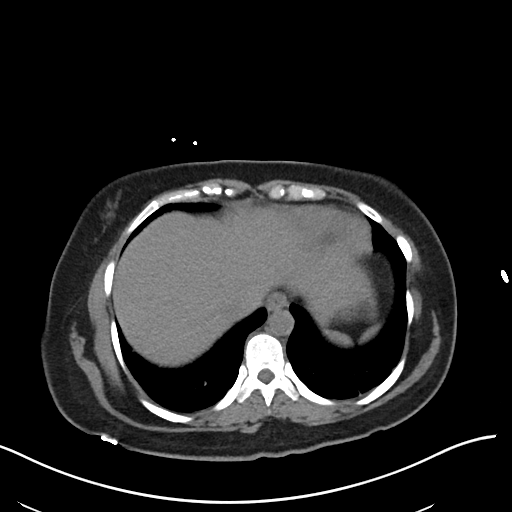
[im 89/96  soft-tissue]
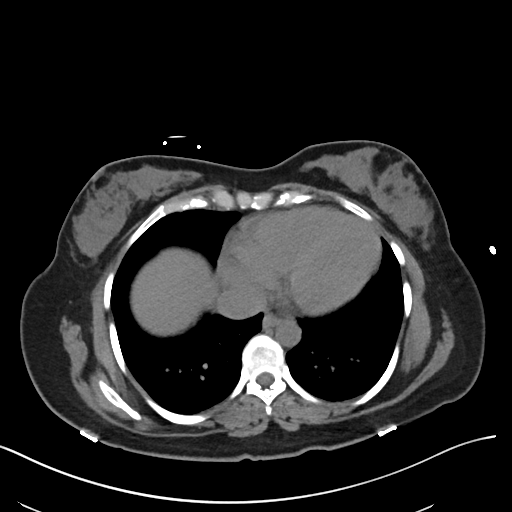

[Series 6: coronal soft tissue · coronal · 0.72mm/px · 3 of 86 slices shown]
[im 29/86  soft-tissue]
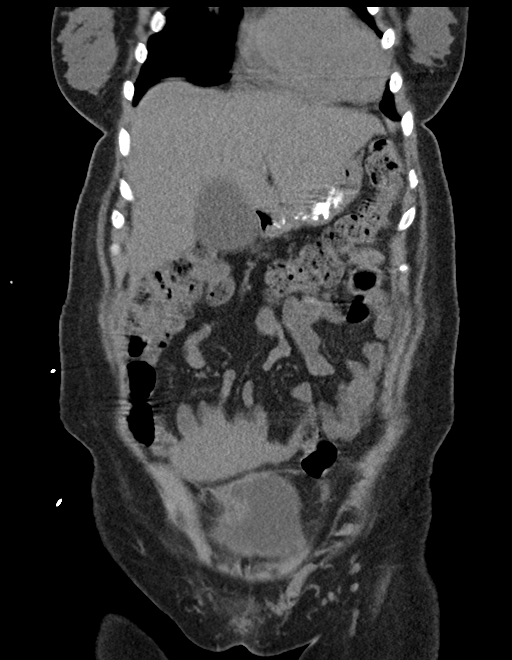
[im 38/86  soft-tissue]
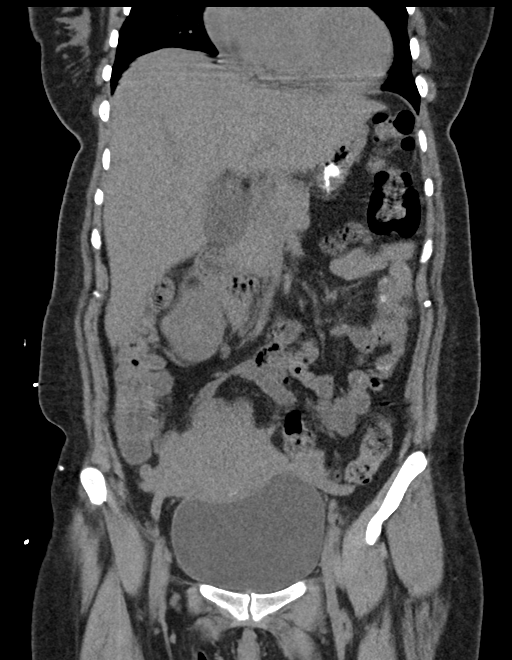
[im 48/86  soft-tissue]
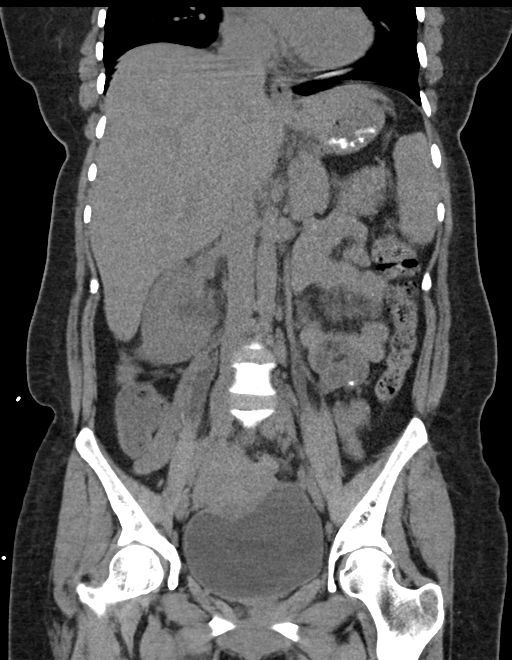

[16 of 46 positions shown; findings below may reference images not displayed]

FINDINGS: Lower chest: Mild dependent atelectasis is seen in the lung bases.
No pleural or pericardial effusion.

Hepatobiliary: No focal liver abnormality is seen. No gallstones,
gallbladder wall thickening, or biliary dilatation.

Pancreas: Unremarkable. No pancreatic ductal dilatation or
surrounding inflammatory changes.

Spleen: Normal in size without focal abnormality.

Adrenals/Urinary Tract: Extensive stranding is seen about the right
kidney and ureter but there is no hydronephrosis or urinary tract
stones. The left kidney and ureter appear normal. Urinary bladder is
normal appearance. No stones are present within the bladder. The
adrenal glands are unremarkable.

Stomach/Bowel: Stomach is within normal limits. Appendix appears
normal. No evidence of bowel wall thickening, distention, or
inflammatory changes.

Vascular/Lymphatic: No significant vascular findings are present. No
enlarged abdominal or pelvic lymph nodes.

Reproductive: The uterus is somewhat prominent consistent with
recent pregnancy. No adnexal mass.

Other: No abscess. Stranding and anterior subcutaneous fat of the
low pelvis is consistent with recent cesarean section.

Musculoskeletal: Negative.
IMPRESSION: Extensive stranding about the right kidney is likely due to
pyelonephritis. No urinary tract stone or hydronephrosis is present.
The exam is otherwise unremarkable.

## 2023-01-23 ENCOUNTER — Other Ambulatory Visit (HOSPITAL_COMMUNITY)
Admission: RE | Admit: 2023-01-23 | Discharge: 2023-01-23 | Disposition: A | Discharge status: Home / Self Care | Payer: Self-pay | Source: Ambulatory Visit | Attending: Internal Medicine | Admitting: Internal Medicine

## 2023-01-23 ENCOUNTER — Ambulatory Visit: Payer: Self-pay | Attending: Internal Medicine | Admitting: Internal Medicine

## 2023-01-23 ENCOUNTER — Encounter: Payer: Self-pay | Admitting: Internal Medicine

## 2023-01-23 VITALS — BP 114/75 | HR 61 | Temp 98.4°F | Ht 63.0 in | Wt 165.0 lb

## 2023-01-23 DIAGNOSIS — Z7689 Persons encountering health services in other specified circumstances: Secondary | ICD-10-CM

## 2023-01-23 DIAGNOSIS — E663 Overweight: Secondary | ICD-10-CM

## 2023-01-23 DIAGNOSIS — Z23 Encounter for immunization: Secondary | ICD-10-CM

## 2023-01-23 DIAGNOSIS — Z124 Encounter for screening for malignant neoplasm of cervix: Secondary | ICD-10-CM

## 2023-01-23 DIAGNOSIS — Z0000 Encounter for general adult medical examination without abnormal findings: Secondary | ICD-10-CM

## 2023-01-23 DIAGNOSIS — Z1231 Encounter for screening mammogram for malignant neoplasm of breast: Secondary | ICD-10-CM

## 2023-01-23 DIAGNOSIS — Z6829 Body mass index (BMI) 29.0-29.9, adult: Secondary | ICD-10-CM

## 2023-01-23 NOTE — Progress Notes (Signed)
Patient ID: Katelyn Miller, female    DOB: Sep 07, 1980  MRN: [id number]  CC: Establish Care (Est care / new pt./No questions / concerns /Yes to flu vax)   Subjective: Katelyn Miller is a 42 y.o. female who presents for new pt visit and physical.  Verneda Skill from [facility] is with her and interprets Her concerns today include:   Patient presents for new patient visit. No previous PCP; use to go to [facility]. No chronic med dx or meds.  Hx of anemia in past.  GYN History:  Pt is G5P5 Any hx of abn paps?: no Menses regular or irregular?:  regular How long does menses last? 2-3 days Menstrual flow light or heavy?:  moderate Method of birth control?:  no Any vaginal dischg at this time?: no Dysuria?: no Any hx of STI?: no Sexually active with how many partners:  spouse only Desires STI screen: no Last MMG: no  Family hx of uterine, cervical or breast cancer?:  no   HM:  last pap was done at [facility] over 3 yrs ago.  Yes to flu shot today Patient Active Problem List   Diagnosis Date Noted   S/P tubal ligation 12/16/2017   Anemia 12/15/2017   Advanced maternal age in multigravida 07/16/2017   Status post repeat low transverse cesarean section 02/16/2016     No current outpatient medications on file prior to visit.   No current facility-administered medications on file prior to visit.    No Known Allergies  Social History   Socioeconomic History   Marital status: Married    Spouse name: Not on file   Number of children: 5   Years of education: Not on file   Highest education level: 9th grade  Occupational History   Occupation: works in Counsellor  Tobacco Use   Smoking status: Never   Smokeless tobacco: Never  Vaping Use   Vaping status: Never Used  Substance and Sexual Activity   Alcohol use: No   Drug use: No   Sexual activity: Yes    Birth control/protection: None  Other Topics Concern   Not on file  Social History Narrative   Reads in [place].    Social Determinants of Health   Financial Resource Strain: Not on file  Food Insecurity: Not on file  Transportation Needs: Not on file  Physical Activity: Not on file  Stress: Not on file  Social Connections: Unknown (09/05/2022)   [facility] from [facility]   Social Network    Social Network: Not on file  Intimate Partner Violence: Unknown (09/05/2022)   Received from [facility]   HITS    Physically Hurt: Not on file    Insult or Talk Down To: Not on file    Threaten Physical Harm: Not on file    Scream or Curse: Not on file    Family History  Problem Relation Age of Onset   Healthy Mother    Healthy Father     Past Surgical History:  Procedure Laterality Date   CESAREAN SECTION     CESAREAN SECTION N/A 02/14/2016   Procedure: CESAREAN SECTION;  Surgeon: Willodean Rosenthal, MD;  Location: [facility] BIRTHING SUITES;  Service: Obstetrics;  Laterality: N/A;   CESAREAN SECTION WITH BILATERAL TUBAL LIGATION N/A 12/14/2017   Procedure: CESAREAN SECTION WITH BILATERAL TUBAL LIGATION;  Surgeon: Hermina Staggers, MD;  Location: [facility] BIRTHING SUITES;  Service: Obstetrics;  Laterality: N/A;   right salpingectomy      ROS: Review of Systems  Constitutional:        Over wgh for height.  Thinks her eating habits are normal. Walks 2x/wk.    HENT:  Negative for congestion, hearing loss, sore throat and trouble swallowing.   Eyes:  Negative for visual disturbance.  Respiratory:  Negative for chest tightness and shortness of breath.   Cardiovascular:  Negative for chest pain.  Gastrointestinal:  Negative for abdominal pain.  Genitourinary:  Negative for menstrual problem.  Psychiatric/Behavioral:  Negative for dysphoric mood.     PHYSICAL EXAM: BP 114/75 (BP Location: Left Arm, Patient Position: Sitting, Cuff Size: Normal)   Pulse 61   Temp 98.4 F (36.9 C) (Oral)   Ht 5\' 3"  (1.6 m)   Wt 165 lb (74.8 kg)   LMP 01/15/2023 (Exact Date)   SpO2 100%   BMI 29.23 kg/m     Physical Exam Constitutional: Appears well-developed and well-nourished. No distress. Head: Normocephalic. Atraumatic Ears: External right and left ear normal.   Eyes: Conjunctivae and EOM are normal. PERRLA, no scleral icterus.  Mouth: no oral lesions, good oral hygiene, throat clear without exudates Neck: Neck supple.  No tracheal deviation. No thyromegaly. No cervical LN CVS: RRR, S1/S2 +, no murmurs, no gallops, no carotid bruit. No JVD Pulmonary: Effort and breath sounds normal, no stridor, rhonchi, wheezes, rales.  Abdominal: Soft. BS +,  no distension, tenderness, rebound or guarding. No organomegaly or masses Musculoskeletal: Normal range of motion. No edema and no tenderness.  Neuro: Alert and oriented x3.  Cns grossly intact, Power: 5/5 BL in all 4s.  Reflexes normal.  Normal  muscle tone, coordination. . Skin: Skin is warm and dry. No rash noted. Not diaphoretic. No erythema. No pallor.  Psychiatric: Normal mood and affect. Behavior, judgment, thought content normal.  Pelvic: CMA Clarissa was present for pelvic and breast exam: No external vaginal lesions.  Cervix grossly normal.  No cervical motion tenderness or adnexal masses.  Uterus felt normal size. Breast exam: Left breast slightly larger than the right.  No palpable masses.  No axillary lymphadenopathy. Extremities: No lower extremity edema.  Moderate varicose veins in both legs including the thigh and lower legs     01/23/2023    9:48 AM 01/12/2015   10:22 AM  Depression screen PHQ 2/9  Decreased Interest 0 0  Down, Depressed, Hopeless 0 0  PHQ - 2 Score 0 0  Altered sleeping 0   Tired, decreased energy 0   Change in appetite 0   Feeling bad or failure about yourself  0   Trouble concentrating 0   Moving slowly or fidgety/restless 0   Suicidal thoughts 0   PHQ-9 Score 0   Difficult doing work/chores Not difficult at all        Latest Ref Rng & Units 01/04/2018   10:33 AM 12/15/2017    5:37 AM  CMP  Glucose  70 - 99 mg/dL 469    BUN 6 - 20 mg/dL 6    Creatinine 6.29 - 1.00 mg/dL 5.28  4.13   Sodium 244 - 145 mmol/L 137    Potassium 3.5 - 5.1 mmol/L 2.7    Chloride 98 - 111 mmol/L 102    CO2 22 - 32 mmol/L 24    Calcium 8.9 - 10.3 mg/dL 8.8    Total Protein 6.5 - 8.1 g/dL 6.6    Total Bilirubin 0.3 - 1.2 mg/dL 1.0    Alkaline Phos 38 - 126 U/L 181    AST 15 - 41  U/L 24    ALT 0 - 44 U/L 30     Lipid Panel  No results found for: "CHOL", "TRIG", "HDL", "CHOLHDL", "VLDL", "LDLCALC", "LDLDIRECT"  CBC    Component Value Date/Time   WBC 11.0 (H) 01/04/2018 1033   RBC 4.13 01/04/2018 1033   HGB 11.0 (L) 01/04/2018 1033   HCT 35.0 (L) 01/04/2018 1033   PLT 317 01/04/2018 1033   MCV 84.7 01/04/2018 1033   MCV 77.3 (A) 01/12/2015 1127   MCH 26.6 01/04/2018 1033   MCHC 31.4 01/04/2018 1033   RDW 18.6 (H) 01/04/2018 1033   LYMPHSABS 2.0 01/04/2018 1033   MONOABS 1.3 (H) 01/04/2018 1033   EOSABS 0.0 01/04/2018 1033   BASOSABS 0.0 01/04/2018 1033    ASSESSMENT AND PLAN: 1. Establishing care with new doctor, encounter for   2. Annual physical exam   3. Overweight (BMI 25.0-29.9) Patient advised to eliminate sugary drinks from the diet, cut back on portion sizes especially of white carbohydrates, eat more white lean meat like chicken [place] and seafood instead of beef or pork and incorporate fresh fruits and vegetables into the diet daily. Encouraged her to get in some form of moderate intensity exercise at least 3 to 5 days a week for 30 minutes. - CBC - Comprehensive metabolic panel - Lipid panel  4. Pap smear for cervical cancer screening - Cytology - PAP  5. Encounter for screening mammogram for malignant neoplasm of breast Mammogram scholarship given. - MM Digital Screening; Future  6. Need for influenza vaccination Given today.     Patient was given the opportunity to ask questions.  Patient verbalized understanding of the plan and was able to repeat key elements of  the plan.   This documentation was completed using Paediatric nurse.  Any transcriptional errors are unintentional.  Orders Placed This Encounter  Procedures   MM Digital Screening   Flu vaccine trivalent PF, 6mos and older(Flulaval,Afluria,Fluarix,Fluzone)   CBC   Comprehensive metabolic panel   Lipid panel     Requested Prescriptions    No prescriptions requested or ordered in this encounter    Return if symptoms worsen or fail to improve.  Jonah Blue, MD, FACP

## 2023-01-23 NOTE — Patient Instructions (Signed)
Alimentacin saludable en los [employer], Adult Una alimentacin saludable puede ayudarlo a Barista y Pharmacologist un peso saludable, reducir el riesgo de tener enfermedades crnicas y vivir Neomia Dear vida larga y productiva. Es importante que siga una modalidad de alimentacin saludable. Sus necesidades nutricionales y calricas deben satisfacerse principalmente con distintos alimentos ricos en nutrientes. Consejos para seguir Surveyor, minerals Lea las etiquetas de los alimentos Lea las etiquetas y elija las que digan lo siguiente: Productos reducidos en sodio o con bajo contenido de [place]. Jugos con 100 % jugo de fruta. Alimentos con bajo contenido de grasas saturadas (menos de 3 g por porcin) y alto contenido de grasas poliinsaturadas y Mining engineer. Alimentos con cereales integrales, como trigo integral, trigo partido, arroz integral y arroz salvaje. Cereales integrales fortificados con cido flico. Esto se recomienda a las mujeres embarazadas o que desean quedar embarazadas. Lea las etiquetas y no coma ni beba lo siguiente: Alimentos o bebidas con azcar agregada. Estos incluyen los alimentos que contienen azcar moreno, endulzante a base de maz, jarabe de maz, dextrosa, fructosa, glucosa, jarabe de maz de alta fructosa, miel, azcar invertido, lactosa, jarabe de [place], maltosa, [place], azcar sin refinar, sacarosa, trehalosa y azcar turbinado. Limite el consumo de azcar agregada a menos del 10 % del total de caloras diarias. No consuma ms que las siguientes cantidades de azcar agregada por da: 6 cucharaditas (25 g) para las mujeres. 9 cucharaditas (38 g) para los hombres. Los alimentos que contienen almidones y cereales refinados o procesados. Los productos de cereales refinados, como harina blanca, harina de maz desgerminada, pan blanco y arroz blanco. Al ir de compras Elija refrigerios ricos en nutrientes, como verduras, frutas enteras y frutos secos. Evite los refrigerios con  alto contenido de caloras y International aid/development worker, como las papas fritas, los refrigerios frutales y los caramelos. Use alios y productos para untar a base de aceite con los Publishing rights manager de grasas slidas como la [place], la [place], la crema agria o el queso crema. Limite las salsas, las mezclas y los productos "instantneos" preelaborados como el arroz saborizado, los fideos instantneos y las pastas listas para comer. Pruebe ms fuentes de protena vegetal, como tofu, tempeh, frijoles negros, edamame, lentejas, frutos secos y semillas. Explore planes de alimentacin como la dieta mediterrnea o la dieta vegetariana. Pruebe salsas cardiosaludables hechas con frijoles y grasas saludables, como hummus y [place]. Las verduras van muy bien con ellas. Al cocinar Use aceite para Designer, multimedia de grasas slidas como [place], margarina o Lovilia de [place]. En lugar de frer, trate de cocinar en el horno, en la plancha o en la parrilla, o hervir los alimentos. Retire la parte grasa de las carnes antes de cocinarlas. Cocine las verduras al vapor en agua o caldo. Planificacin de las comidas  En las comidas, imagine dividir su plato en cuartos: La mitad del plato tiene frutas y verduras. Un cuarto del plato tiene cereales integrales. Un cuarto del plato tiene protena, especialmente carnes [place], aves, huevos, tofu, frijoles o frutos secos. Incluya lcteos descremados en su dieta diaria. Estilo de vida Elija opciones saludables en todos los mbitos, como en el hogar, el [place], la [place], los restaurantes y [place]. Prepare los alimentos de un modo seguro: Lvese las manos despus de manipular carnes crudas. Donde prepare alimentos, mantenga las superficies limpias lavndolas regularmente con agua caliente y [place]. Mantenga las carnes crudas separadas de los alimentos que estn listos para comer como las frutas y las verduras. Cocine los frutos  de mar, carnes, aves y Loss adjuster, chartered la temperatura recomendada. Consiga un termmetro para alimentos. Almacene los alimentos a temperaturas seguras. En general: Mantenga los alimentos fros a una temperatura de 40 F (4,4 C) o inferior. Mantenga los alimentos calientes a una temperatura de 140 F (60 C) o superior. Mantenga el congelador a una temperatura de 0 F (-17,8 C) o inferior. Los alimentos no son seguros para su consumo cuando han estado a una temperatura de entre 40 y 140 F (4.4 y 60 C) por ms de 2 horas. Qu alimentos debo comer? Frutas Propngase comer entre 1 y 2 tazas de frutas frescas, Primary school teacher (en su jugo natural) o Primary school teacher. Una taza de fruta equivale a 1 manzana pequea, 1 banana grande, 8 fresas grandes, 1 taza (237 g) de fruta enlatada,  taza (82 g) de fruta seca o 1 taza (240 ml) de jugo al 100 %. Verduras Propngase comer de 2 a 4 tazas de verduras frescas y Primary school teacher, incluyendo diferentes variedades y colores. Una taza de verduras equivale a 1 taza (91 g) de brcoli o coliflor, 2 zanahorias medianas, 2 tazas (150 g) de verduras de [employer] crudas, 1 tomate grande, 1 pimiento morrn grande, 1 batata grande o 1 patata blanca mediana. Cereales Propngase comer el equivalente a entre 4 y 10 onzas de cereales integrales por Futures trader. Algunos ejemplos de equivalentes a 1 onza de cereales son 1 rebanada de pan, 1 taza (40 g) de cereal listo para comer, 3 tazas (24 g) de palomitas de maz o  taza (93 g) de arroz cocido. Carnes y otras protenas Propngase comer el equivalente a entre 5 y 7  onzas de protena por Futures trader. Algunos ejemplos de equivalentes a 1 onza de protenas incluyen 1 huevo,  oz de frutos secos (12 almendras, 24 pistachos o 7 mitades de nueces), 1/4 taza (90 g) de frijoles cocidos, 6 cucharadas (90 g) de hummus o 1 cucharada (16 g) de [place] de man. Un corte de carne o pescado del tamao de un mazo de cartas equivale aproximadamente a 3 a 4 onzas (85  g). De las protenas que consume cada semana, intente que al menos 8 onzas (227 g) sean frutos de mar. Esto equivale a unas 2 porciones por semana. Esto incluye salmn, trucha, arenque y anchoas. Lcteos [employer] a 3 tazas de lcteos descremados o con bajo contenido de Museum/gallery curator. Algunos ejemplos de equivalentes a 1 taza de lcteos son 1 taza (240 ml) de leche, 8 onzas (250 g) de yogur, 1 onzas (44 g) de queso natural o 1 taza (240 ml) de leche de soja fortificada. Grasas y aceites Propngase consumir alrededor de 5 cucharaditas (21 g) de grasas y Acupuncturist. Elija grasas monoinsaturadas, como el aceite de canola y de [place], la [place] con aceite de [place] o de [place], [place], [place] de man y Games developer de los frutos secos, o bien grasas poliinsaturadas, como el aceite de [place], maz y soja, nueces, piones, semillas de ssamo, semillas de girasol y semillas de lino. Bebidas Propngase beber 6 vasos de 8 onzas de Warehouse manager. Limite el caf a entre 3 y 5 tazas de ocho onzas por Futures trader. Limite el consumo de bebidas con cafena que tengan caloras agregadas, como los refrescos y las bebidas energizantes. Si bebe alcohol: Limite la cantidad que bebe a lo siguiente: De 0 a 1 medida al da si es [place]. De 0 a 2 medidas  al da si es varn. Sepa cunta cantidad de alcohol hay en las bebidas que toma. En los [address], una medida es una botella de cerveza de 12 oz (355 ml), un vaso de vino de 5 oz (148 ml) o un vaso de una bebida alcohlica de alta graduacin de 1 oz (44 ml). Condimentos y otros alimentos Trate de no agregar demasiada sal a los alimentos. Trate de usar hierbas y especias en lugar de sal. Trate de no agregar azcar a los alimentos. Esta informacin se basa en las pautas de nutricin de los EE. [employer]. Para obtener ms informacin, visite [email or web address]. Las Information systems manager. Es posible que necesite cantidades  diferentes. Esta informacin no tiene Theme park manager el consejo del mdico. Asegrese de hacerle al mdico cualquier pregunta que tenga. Document Revised: 02/18/2022 Document Reviewed: 02/18/2022 Elsevier Patient Education  2024 Elsevier Inc.   Cuidados preventivos en las mujeres de 40 a 64 aos de edad Preventive Care 33-44 Years Old, Female Los cuidados preventivos hacen referencia a las opciones en cuanto al estilo de vida y a las visitas al mdico, las cuales pueden promover la salud y Counsellor. Las visitas de cuidado preventivo tambin se denominan exmenes de Health visitor. Qu puedo esperar para mi visita de cuidado preventivo? Asesoramiento Su mdico puede preguntarle acerca de: Antecedentes mdicos, incluidos los siguientes: Problemas mdicos pasados. Antecedentes mdicos familiares. Antecedentes de embarazo. Salud actual, incluido lo siguiente: Ciclo menstrual. Mtodos anticonceptivos. Su bienestar emocional. Training and development officer y las relaciones personales. Actividad sexual y salud sexual. Doran Clay de vida, incluido lo siguiente: Consumo de alcohol, nicotina, tabaco o drogas. Acceso a armas de fuego. Hbitos de alimentacin, ejercicio y sueo. Su trabajo y [place] laboral. Uso de pantalla solar. Cuestiones de seguridad, como el uso de cinturn de seguridad y casco de Scientist, research (physical sciences). Examen fsico El mdico revisar lo siguiente: Diplomatic Services operational officer y [place]. Estos pueden usarse para calcular el IMC (ndice de masa corporal). El [facility] es una medicin que indica si tiene un peso saludable. Circunferencia de la cintura. Es Neomia Dear medicin alrededor de Lobbyist. Esta medicin tambin indica si tiene un peso saludable y puede ayudar a predecir su riesgo de padecer ciertas enfermedades, como diabetes tipo 2 y presin arterial alta. Frecuencia cardaca y presin arterial. Temperatura corporal. Piel para detectar manchas anormales. Qu vacunas necesito?  Las vacunas se aplican a varias  edades, segn un cronograma. El [employer] recomendar vacunas segn su edad, sus antecedentes mdicos, su estilo de vida y [address], como los viajes o el lugar donde trabaja. Qu pruebas necesito? Pruebas de deteccin El mdico puede recomendar pruebas de deteccin de ciertas afecciones. Esto puede incluir: Niveles de lpidos y colesterol. Pruebas de deteccin de la diabetes. Esto se Physiological scientist un control del azcar en la sangre (glucosa) despus de no haber comido durante un periodo de tiempo (ayuno). Examen plvico y prueba de Papanicolaou. Prueba de hepatitis B. Prueba de hepatitis C. Prueba del VIH (virus de inmunodeficiencia humana). Pruebas de infecciones de transmisin sexual (ITS), si est en riesgo. Pruebas de deteccin de cncer de pulmn. Pruebas de deteccin de cncer colorrectal. Mamografa. Hable con su mdico sobre cundo debe comenzar a Health and safety inspector de [place] regular. Esto depende de si tiene antecedentes familiares de cncer de mama o no. Pruebas de deteccin de cncer relacionado con las mutaciones del BRCA. Es posible que se las deba realizar si tiene antecedentes de cncer de mama, de ovario, de trompas o peritoneal. Densitometra sea. Esto se realiza  para detectar osteoporosis. Hable con su mdico [employer], las opciones de tratamiento y, si corresponde, la necesidad de Education officer, environmental ms pruebas. Siga estas instrucciones en su casa: Comida y bebida  Siga una dieta que incluya frutas y verduras frescas, cereales integrales, protenas magras y productos lcteos descremados. Tome los suplementos vitamnicos y [employer] se lo haya indicado el mdico. No beba alcohol si: Su mdico le indica no hacerlo. Est embarazada, puede estar embarazada o est tratando de [place]. Si bebe alcohol: Limite la cantidad que consume de 0 a 1 medida por da. Sepa cunta cantidad de alcohol hay en las bebidas que toma. En los [address] una medida equivale a una botella de cerveza de 12 oz (355 ml), un vaso de vino de 5 oz (148 ml) o un vaso de una bebida alcohlica de alta graduacin de 1 oz (44 ml). Estilo de [employer] dientes a la maana y a la noche con Conservator, museum/gallery con fluoruro. Use hilo dental una vez al da. Haga al menos 30 minutos de ejercicio, 5 o ms [address] No consuma ningn producto que contenga nicotina o tabaco. Estos productos incluyen cigarrillos, tabaco para Theatre manager y aparatos de vapeo, como los Administrator, Civil Service. Si necesita ayuda para dejar de fumar, consulte al mdico. No consuma drogas. Si es sexualmente activa, practique sexo seguro. Use un condn u otra forma de proteccin para prevenir las infecciones de transmisin sexual (ITS). Si no desea quedar embarazada, use un mtodo anticonceptivo. Si busca un embarazo, realice una consulta previa al [employer] con el mdico. Tome aspirina nicamente como se lo haya indicado el mdico. Asegrese de que comprende qu cantidad y cul presentacin debe tomar. Trabaje con el mdico para averiguar si es seguro y beneficioso para usted tomar aspirina a diario. Busque maneras saludables de Charity fundraiser, tales como: Meditacin, yoga o Optometrist. Lleve un diario personal. Hable con una persona confiable. Pase tiempo con amigos y familiares. Minimice la exposicin a la radiacin UV para reducir el riesgo de cncer de piel. Seguridad [place] siempre el cinturn de seguridad al conducir o viajar en un vehculo. No conduzca: Si ha estado bebiendo alcohol. No viaje con un conductor que ha estado bebiendo. Si est cansada o distrada. Mientras est enviando mensajes de texto. Si ha estado usando sustancias o drogas que alteran la funcin mental. Use un casco y otros equipos de proteccin durante las actividades deportivas. Si tiene armas de fuego en su casa, asegrese de seguir todos los procedimientos de seguridad  correspondientes. Busque ayuda si fue vctima de abuso fsico o abuso sexual. Cundo volver? Visite al mdico una vez al ao para una visita anual de control de bienestar. Pregntele al mdico con qu frecuencia debe realizarse un control de la vista y los dientes. Mantenga su esquema de vacunacin al da. Esta informacin no tiene Theme park manager el consejo del mdico. Asegrese de hacerle al mdico cualquier pregunta que tenga. Document Revised: 11/08/2020 Document Reviewed: 11/08/2020 Elsevier Patient Education  2024 [employer].

## 2023-01-24 LAB — COMPREHENSIVE METABOLIC PANEL
ALT: 18 IU/L (ref 0–32)
AST: 20 IU/L (ref 0–40)
Albumin: 4.2 g/dL (ref 3.9–4.9)
Alkaline Phosphatase: 72 IU/L (ref 44–121)
BUN/Creatinine Ratio: 13 (ref 9–23)
BUN: 9 mg/dL (ref 6–24)
Bilirubin Total: 0.2 mg/dL (ref 0.0–1.2)
CO2: 22 mmol/L (ref 20–29)
Calcium: 9.3 mg/dL (ref 8.7–10.2)
Chloride: 104 mmol/L (ref 96–106)
Creatinine, Ser: 0.71 mg/dL (ref 0.57–1.00)
Globulin, Total: 2.6 g/dL (ref 1.5–4.5)
Glucose: 97 mg/dL (ref 70–99)
Potassium: 3.9 mmol/L (ref 3.5–5.2)
Sodium: 140 mmol/L (ref 134–144)
Total Protein: 6.8 g/dL (ref 6.0–8.5)
eGFR: 109 mL/min/{1.73_m2} (ref >59)

## 2023-01-24 LAB — LIPID PANEL
Chol/HDL Ratio: 4 ratio (ref 0.0–4.4)
Cholesterol, Total: 188 mg/dL (ref 100–199)
HDL: 47 mg/dL (ref >39)
LDL Chol Calc (NIH): 113 mg/dL — ABNORMAL HIGH (ref 0–99)
Triglycerides: 158 mg/dL — ABNORMAL HIGH (ref 0–149)
VLDL Cholesterol Cal: 28 mg/dL (ref 5–40)

## 2023-01-24 LAB — CBC
Hematocrit: 36.7 % (ref 34.0–46.6)
Hemoglobin: 11.4 g/dL (ref 11.1–15.9)
MCH: 24.6 pg — ABNORMAL LOW (ref 26.6–33.0)
MCHC: 31.1 g/dL — ABNORMAL LOW (ref 31.5–35.7)
MCV: 79 fL (ref 79–97)
Platelets: 404 10*3/uL (ref 150–450)
RBC: 4.64 x10E6/uL (ref 3.77–5.28)
RDW: 14.6 % (ref 11.7–15.4)
WBC: 6.8 10*3/uL (ref 3.4–10.8)

## 2023-01-27 LAB — CYTOLOGY - PAP
Comment: NEGATIVE
Diagnosis: NEGATIVE
High risk HPV: NEGATIVE

## 2023-02-02 ENCOUNTER — Telehealth: Payer: Self-pay

## 2023-02-02 NOTE — Telephone Encounter (Signed)
Telephoned patient at mobile number using interpreter#[phone]. Left voice message with [facility] (scholarship) contact information.

## 2024-01-20 ENCOUNTER — Encounter: Payer: Self-pay | Admitting: Internal Medicine

## 2024-03-03 ENCOUNTER — Encounter: Payer: Self-pay | Admitting: Internal Medicine

## 2024-03-03 ENCOUNTER — Other Ambulatory Visit: Payer: Self-pay

## 2024-03-03 ENCOUNTER — Ambulatory Visit: Payer: Self-pay | Attending: Internal Medicine | Admitting: Internal Medicine

## 2024-03-03 VITALS — BP 117/75 | HR 71 | Resp 18 | Ht 63.0 in | Wt 171.6 lb

## 2024-03-03 DIAGNOSIS — Z1231 Encounter for screening mammogram for malignant neoplasm of breast: Secondary | ICD-10-CM

## 2024-03-03 DIAGNOSIS — B001 Herpesviral vesicular dermatitis: Secondary | ICD-10-CM

## 2024-03-03 DIAGNOSIS — B351 Tinea unguium: Secondary | ICD-10-CM

## 2024-03-03 DIAGNOSIS — Z23 Encounter for immunization: Secondary | ICD-10-CM

## 2024-03-03 DIAGNOSIS — I8393 Asymptomatic varicose veins of bilateral lower extremities: Secondary | ICD-10-CM

## 2024-03-03 DIAGNOSIS — Z0000 Encounter for general adult medical examination without abnormal findings: Secondary | ICD-10-CM

## 2024-03-03 DIAGNOSIS — E782 Mixed hyperlipidemia: Secondary | ICD-10-CM

## 2024-03-03 DIAGNOSIS — E66811 Obesity, class 1: Secondary | ICD-10-CM

## 2024-03-03 DIAGNOSIS — Z1159 Encounter for screening for other viral diseases: Secondary | ICD-10-CM

## 2024-03-03 DIAGNOSIS — Z6830 Body mass index (BMI) 30.0-30.9, adult: Secondary | ICD-10-CM

## 2024-03-03 MED ORDER — ACYCLOVIR 5 % EX CREA: TOPICAL_CREAM | CUTANEOUS | 0 refills | Status: DC

## 2024-03-03 MED ORDER — TERBINAFINE HCL 250 MG PO TABS: 250.0000 mg | ORAL_TABLET | Freq: Every day | ORAL | 1 refills | Status: DC

## 2024-03-03 NOTE — Progress Notes (Signed)
 Patient ID: Katelyn Miller, female    DOB: 1980/11/17  MRN: [id number]  CC: Annual Exam   Subjective: Katelyn Miller is a 43 y.o. female who presents for physical. Katelyn Miller from [facility] is with her to interpret. . Her concerns today include:  Pt with hx of HL  Discussed the use of AI scribe software for clinical note transcription with the patient, who gave verbal consent to proceed.  History of Present Illness Katelyn Miller is a 43 year old female who presents for an annual physical exam.  She experiences difficulty with near vision, requiring her to hold objects further away to see them clearly. She has not had an eye exam recently.  She has varicose veins that occasionally cause itching but not pain unless bumped.  She has a blister on her lip for about two weeks, which started with itching and has been painful. She has not experienced this before.  She has two toenails described as 'bad' and has had similar issues in the past that resolved with treatment. Her toenails were previously treated by a doctor.  No issues with hearing, chronic cough, shortness of breath, chest pain, or difficulty swallowing. No issues with bowel movements or abdominal pain, but she notes occasional blood in her stools during her menstrual period. Urinating okay, no dysuria. No jt pains.  She works in [employer] and has five children. She walks with her children two to three times a week for 30 to 60 minutes. Her weight has increased by six pounds over the past year, from 165 to 171 pounds. Her diet primarily consists of water, with occasional sodas. LDL cholesterol mildly elev on last visit.  She denies smoking, alcohol use, and illicit drug use.    Patient Active Problem List   Diagnosis Date Noted   S/P tubal ligation 12/16/2017   Anemia 12/15/2017   Advanced maternal age in multigravida 07/16/2017   Status post repeat low transverse cesarean section  02/16/2016     No current outpatient medications on file prior to visit.   No current facility-administered medications on file prior to visit.    No Known Allergies  Social History   Socioeconomic History   Marital status: Married    Spouse name: Not on file   Number of children: 5   Years of education: Not on file   Highest education level: 9th grade  Occupational History   Occupation: works in counsellor  Tobacco Use   Smoking status: Never   Smokeless tobacco: Never  Vaping Use   Vaping status: Never Used  Substance and Sexual Activity   Alcohol use: No   Drug use: No   Sexual activity: Yes    Birth control/protection: None  Other Topics Concern   Not on file  Social History Narrative   Reads in Spanish.   Social Drivers of Corporate Investment Banker Strain: Not on file  Food Insecurity: Not on file  Transportation Needs: Not on file  Physical Activity: Not on file  Stress: Not on file  Social Connections: Unknown (09/05/2022)   Received [facility]   Social Network    Social Network: Not on file  Intimate Partner Violence: Unknown (09/05/2022)   Received from [facility]   HITS    Physically Hurt: Not on file    Insult or Talk Down To: Not on file    Threaten Physical Harm: Not on file    Scream or Curse: Not on file  Family History  Problem Relation Age of Onset   Healthy Mother    Healthy Father     Past Surgical History:  Procedure Laterality Date   CESAREAN SECTION     CESAREAN SECTION N/A 02/14/2016   Procedure: CESAREAN SECTION;  Surgeon: Elveria Mungo, MD;  Location: [facility] BIRTHING SUITES;  Service: Obstetrics;  Laterality: N/A;   CESAREAN SECTION WITH BILATERAL TUBAL LIGATION N/A 12/14/2017   Procedure: CESAREAN SECTION WITH BILATERAL TUBAL LIGATION;  Surgeon: Lorence Ozell CROME, MD;  Location: [facility] BIRTHING SUITES;  Service: Obstetrics;  Laterality: N/A;   right salpingectomy      ROS: Review of Systems Negative except as  stated above  PHYSICAL EXAM: BP 117/75 (BP Location: Left Arm, Patient Position: Sitting, Cuff Size: Normal)   Pulse 71   Resp 18   Ht 5' 3 (1.6 m)   Wt 171 lb 9.6 oz (77.8 kg)   LMP 02/11/2024 (Approximate)   SpO2 100%   Breastfeeding No   BMI 30.40 kg/m   Wt Readings from Last 3 Encounters:  03/03/24 171 lb 9.6 oz (77.8 kg)  01/23/23 165 lb (74.8 kg)  12/14/17 155 lb 4 oz (70.4 kg)    Physical Exam  General appearance - alert, well appearing, and in no distress Mental status - normal mood, behavior, speech, dress, motor activity, and thought processes Eyes - pupils equal and reactive, extraocular eye movements intact Ears - bilateral TM's and external ear canals normal Nose - normal and patent, no erythema, discharge or polyps Mouth - mucous membranes moist, pharynx normal without lesions Neck - supple, no significant adenopathy Lymphatics - no palpable lymphadenopathy, no hepatosplenomegaly Chest - clear to auscultation, no wheezes, rales or rhonchi, symmetric air entry Heart - normal rate, regular rhythm, normal S1, S2, no murmurs, rubs, clicks or gallops Abdomen - soft, nontender, nondistended, no masses or organomegaly Pelvic - deferred as pt not due for pap Neurological - cranial nerves II through XII intact, motor and sensory grossly normal bilaterally Musculoskeletal - no joint tenderness, deformity or swelling Extremities - peripheral pulses normal, no pedal edema, no clubbing or cyanosis. Moderate to severe varicose veins in both lower legs and feet. Nails: Nails are cut short.  Discoloration and thickness of the nail on the right fourth and left 3rd and 4th toes.      Latest Ref Rng & Units 01/23/2023    9:54 AM 01/04/2018   10:33 AM 12/15/2017    5:37 AM  CMP  Glucose 70 - 99 mg/dL 97  887    BUN 6 - 24 mg/dL 9  6    Creatinine 9.42 - 1.00 mg/dL 9.28  9.37  9.47   Sodium 134 - 144 mmol/L 140  137    Potassium 3.5 - 5.2 mmol/L 3.9  2.7    Chloride 96 - 106  mmol/L 104  102    CO2 20 - 29 mmol/L 22  24    Calcium 8.7 - 10.2 mg/dL 9.3  8.8    Total Protein 6.0 - 8.5 g/dL 6.8  6.6    Total Bilirubin 0.0 - 1.2 mg/dL 0.2  1.0    Alkaline Phos 44 - 121 IU/L 72  181    AST 0 - 40 IU/L 20  24    ALT 0 - 32 IU/L 18  30     Lipid Panel     Component Value Date/Time   CHOL 188 01/23/2023 0954   TRIG 158 (H) 01/23/2023 0954   HDL  47 01/23/2023 0954   CHOLHDL 4.0 01/23/2023 0954   LDLCALC 113 (H) 01/23/2023 0954    CBC    Component Value Date/Time   WBC 6.8 01/23/2023 0954   WBC 11.0 (H) 01/04/2018 1033   RBC 4.64 01/23/2023 0954   RBC 4.13 01/04/2018 1033   HGB 11.4 01/23/2023 0954   HCT 36.7 01/23/2023 0954   PLT 404 01/23/2023 0954   MCV 79 01/23/2023 0954   MCH 24.6 (L) 01/23/2023 0954   MCH 26.6 01/04/2018 1033   MCHC 31.1 (L) 01/23/2023 0954   MCHC 31.4 01/04/2018 1033   RDW 14.6 01/23/2023 0954   LYMPHSABS 2.0 01/04/2018 1033   MONOABS 1.3 (H) 01/04/2018 1033   EOSABS 0.0 01/04/2018 1033   BASOSABS 0.0 01/04/2018 1033    ASSESSMENT AND PLAN: 1. Annual physical exam (Primary) Discussed the importance of getting routine eye exam at least once every 2 years.  She will schedule an appointment.  Advised of the importance of routine dental cleaning at least twice a year.  2. Obesity (BMI 30.0-34.9) Patient advised to eliminate sugary drinks from the diet, cut back on portion sizes especially of white carbohydrates, eat more white lean meat like chicken turkey and seafood instead of beef or pork and incorporate fresh fruits and vegetables into the diet daily. Commended her on exercise.  Advised that the goal is to get in about a 150 minutes/week total of moderate intensity exercise. - CBC - Comprehensive metabolic panel with GFR - Lipid panel  3. Mixed hyperlipidemia See #2 above  4. Herpes labialis Since this has been present for 2 weeks and does not completely resolve, we will give some acyclovir cream to use for several  days - acyclovir cream (ZOVIRAX) 5 %; Apply 5 times daily on fever blister on upper lip for 3 days. Do not swallow  Dispense: 15 g; Refill: 0  5. Asymptomatic varicose veins of both lower extremities Advise use of compression socks knee-high  6. Onychomycosis Discussed treatment of toenail fungus using the medicine Lamisil.  Advised of potential side effects of drug-induced hepatitis.  Will check baseline liver function test today.  Advised patient that once she is on the medicine should she develop any jaundice or pain in the right upper quadrant of the abdomen, she should stop the medicine and come in.  We will have her follow-up in about 6 weeks - terbinafine (LAMISIL) 250 MG tablet; Take 1 tablet (250 mg total) by mouth daily.  Dispense: 30 tablet; Refill: 1  7. Breast cancer screening by mammogram Pt signed [employer] scholarship form - MS 3D SCR MAMMO BILAT BR (aka MM); Future  8. Need for influenza vaccination - Flu vaccine trivalent PF, 6mos and older(Flulaval,Afluria,Fluarix,Fluzone)  9. Need for hepatitis C screening test - Hepatitis C Antibody  Patient was given the opportunity to ask questions.  Patient verbalized understanding of the plan and was able to repeat key elements of the plan.   This documentation was completed using Paediatric nurse.  Any transcriptional errors are unintentional.  Orders Placed This Encounter  Procedures   MS 3D SCR MAMMO BILAT BR (aka MM)   Flu vaccine trivalent PF, 6mos and older(Flulaval,Afluria,Fluarix,Fluzone)   CBC   Comprehensive metabolic panel with GFR   Lipid panel   Hepatitis C Antibody     Requested Prescriptions   Signed Prescriptions Disp Refills   terbinafine (LAMISIL) 250 MG tablet 30 tablet 1    Sig: Take 1 tablet (250 mg total) by mouth  daily.   acyclovir cream (ZOVIRAX) 5 % 15 g 0    Sig: Apply 5 times daily on fever blister on upper lip for 3 days. Do not swallow    Return in about 6 weeks (around  04/14/2024) for f/u on toenail fungus.  Barnie Louder, MD, FACP

## 2024-03-03 NOTE — Patient Instructions (Signed)
Cuidados preventivos en las mujeres de 40 a 64 aos de edad Preventive Care 43-42 Years Old, Female Los cuidados preventivos hacen referencia a las opciones en cuanto al estilo de vida y a las visitas al mdico, las cuales pueden promover la salud y Counsellor. Las visitas de cuidado preventivo tambin se denominan exmenes de Health visitor. Qu puedo esperar para mi visita de cuidado preventivo? Asesoramiento Su mdico puede preguntarle acerca de: Antecedentes mdicos, incluidos los siguientes: Problemas mdicos pasados. Antecedentes mdicos familiares. Antecedentes de embarazo. Salud actual, incluido lo siguiente: Ciclo menstrual. Mtodos anticonceptivos. Su bienestar emocional. Training and development officer y las relaciones personales. Actividad sexual y salud sexual. Doran Clay de vida, incluido lo siguiente: Consumo de alcohol, nicotina, tabaco o drogas. Acceso a armas de fuego. Hbitos de alimentacin, ejercicio y sueo. Su trabajo y [place] laboral. Uso de pantalla solar. Cuestiones de seguridad, como el uso de cinturn de seguridad y casco de Scientist, research (physical sciences). Examen fsico El mdico revisar lo siguiente: Diplomatic Services operational officer y [place]. Estos pueden usarse para calcular el IMC (ndice de masa corporal). El [facility] es una medicin que indica si tiene un peso saludable. Circunferencia de la cintura. Es Neomia Dear medicin alrededor de Lobbyist. Esta medicin tambin indica si tiene un peso saludable y puede ayudar a predecir su riesgo de padecer ciertas enfermedades, como diabetes tipo 2 y presin arterial alta. Frecuencia cardaca y presin arterial. Temperatura corporal. Piel para detectar manchas anormales. Qu vacunas necesito?  Las vacunas se aplican a varias edades, segn un cronograma. El [employer] recomendar vacunas segn su edad, sus antecedentes mdicos, su estilo de vida y [address], como los viajes o el lugar donde trabaja. Qu pruebas necesito? Pruebas de deteccin El mdico puede recomendar  pruebas de deteccin de ciertas afecciones. Esto puede incluir: Niveles de lpidos y colesterol. Pruebas de deteccin de la diabetes. Esto se Physiological scientist un control del azcar en la sangre (glucosa) despus de no haber comido durante un periodo de tiempo (ayuno). Examen plvico y prueba de Papanicolaou. Prueba de hepatitis B. Prueba de hepatitis C. Prueba del VIH (virus de inmunodeficiencia humana). Pruebas de infecciones de transmisin sexual (ITS), si est en riesgo. Pruebas de deteccin de cncer de pulmn. Pruebas de deteccin de cncer colorrectal. Mamografa. Hable con su mdico sobre cundo debe comenzar a Health and safety inspector de [place] regular. Esto depende de si tiene antecedentes familiares de cncer de mama o no. Pruebas de deteccin de cncer relacionado con las mutaciones del BRCA. Es posible que se las deba realizar si tiene antecedentes de cncer de mama, de ovario, de trompas o peritoneal. Densitometra sea. Esto se realiza para detectar osteoporosis. Hable con su mdico [employer], las opciones de tratamiento y, si corresponde, la necesidad de Education officer, environmental ms pruebas. Siga estas instrucciones en su casa: Comida y bebida  Siga una dieta que incluya frutas y verduras frescas, cereales integrales, protenas magras y productos lcteos descremados. Tome los suplementos vitamnicos y [employer] se lo haya indicado el mdico. No beba alcohol si: Su mdico le indica no hacerlo. Est embarazada, puede estar embarazada o est tratando de [place]. Si bebe alcohol: Limite la cantidad que consume de 0 a 1 medida por da. Sepa cunta cantidad de alcohol hay en las bebidas que toma. En los [address], una medida equivale a una botella de cerveza de 12 oz (355 ml), un vaso de vino de 5 oz (148 ml) o un vaso de una bebida alcohlica de alta graduacin de 1 oz (44  ml). Estilo de vida [employer] dientes a la maana y a la noche con pasta  dental con fluoruro. Use hilo dental una vez al da. Haga al menos 30 minutos de ejercicio, 5 o ms [address]. No consuma ningn producto que contenga nicotina o tabaco. Estos productos incluyen cigarrillos, tabaco para Theatre manager y aparatos de vapeo, como los Administrator, Civil Service. Si necesita ayuda para dejar de fumar, consulte al mdico. No consuma drogas. Si es sexualmente activa, practique sexo seguro. Use un condn u otra forma de proteccin para prevenir las infecciones de transmisin sexual (ITS). Si no desea quedar embarazada, use un mtodo anticonceptivo. Si busca un embarazo, realice una consulta previa al [employer] con el mdico. Tome aspirina nicamente como se lo haya indicado el mdico. Asegrese de que comprende qu cantidad y cul presentacin debe tomar. Trabaje con el mdico para averiguar si es seguro y beneficioso para usted tomar aspirina a diario. Busque maneras saludables de Charity fundraiser, tales como: Meditacin, yoga o Optometrist. Lleve un diario personal. Hable con una persona confiable. Pase tiempo con amigos y familiares. Minimice la exposicin a la radiacin UV para reducir el riesgo de cncer de piel. Seguridad [place] siempre el cinturn de seguridad al conducir o viajar en un vehculo. No conduzca: Si ha estado bebiendo alcohol. No viaje con un conductor que ha estado bebiendo. Si est cansada o distrada. Mientras est enviando mensajes de texto. Si ha estado usando sustancias o drogas que alteran la funcin mental. Use un casco y otros equipos de proteccin durante las actividades deportivas. Si tiene armas de fuego en su casa, asegrese de seguir todos los procedimientos de seguridad correspondientes. Busque ayuda si fue vctima de abuso fsico o abuso sexual. Cundo volver? Visite al mdico una vez al ao para una visita anual de control de bienestar. Pregntele al mdico con qu frecuencia debe realizarse un control de la vista y los  dientes. Mantenga su esquema de vacunacin al da. Esta informacin no tiene Theme park manager el consejo del mdico. Asegrese de hacerle al mdico cualquier pregunta que tenga. Document Revised: 11/08/2020 Document Reviewed: 11/08/2020 Elsevier Patient Education  2024 [employer].

## 2024-03-04 ENCOUNTER — Other Ambulatory Visit: Payer: Self-pay

## 2024-03-04 ENCOUNTER — Ambulatory Visit: Payer: Self-pay | Admitting: Internal Medicine

## 2024-03-04 ENCOUNTER — Other Ambulatory Visit: Payer: Self-pay | Admitting: Internal Medicine

## 2024-03-04 DIAGNOSIS — B351 Tinea unguium: Secondary | ICD-10-CM

## 2024-03-04 DIAGNOSIS — B001 Herpesviral vesicular dermatitis: Secondary | ICD-10-CM

## 2024-03-04 LAB — COMPREHENSIVE METABOLIC PANEL WITH GFR
ALT: 20 IU/L (ref 0–32)
AST: 21 IU/L (ref 0–40)
Albumin: 4.4 g/dL (ref 3.9–4.9)
Alkaline Phosphatase: 75 IU/L (ref 41–116)
BUN/Creatinine Ratio: 18 (ref 9–23)
BUN: 13 mg/dL (ref 6–24)
Bilirubin Total: <0.2 mg/dL (ref 0.0–1.2)
CO2: 21 mmol/L (ref 20–29)
Calcium: 9.4 mg/dL (ref 8.7–10.2)
Chloride: 101 mmol/L (ref 96–106)
Creatinine, Ser: 0.74 mg/dL (ref 0.57–1.00)
Globulin, Total: 2.5 g/dL (ref 1.5–4.5)
Glucose: 112 mg/dL — ABNORMAL HIGH (ref 70–99)
Potassium: 4.4 mmol/L (ref 3.5–5.2)
Sodium: 138 mmol/L (ref 134–144)
Total Protein: 6.9 g/dL (ref 6.0–8.5)
eGFR: 104 mL/min/1.73 (ref >59)

## 2024-03-04 LAB — LIPID PANEL
Chol/HDL Ratio: 4.6 ratio — ABNORMAL HIGH (ref 0.0–4.4)
Cholesterol, Total: 216 mg/dL — ABNORMAL HIGH (ref 100–199)
HDL: 47 mg/dL (ref >39)
LDL Chol Calc (NIH): 118 mg/dL — ABNORMAL HIGH (ref 0–99)
Triglycerides: 291 mg/dL — ABNORMAL HIGH (ref 0–149)
VLDL Cholesterol Cal: 51 mg/dL — ABNORMAL HIGH (ref 5–40)

## 2024-03-04 LAB — CBC
Hematocrit: 36.9 % (ref 34.0–46.6)
Hemoglobin: 11.6 g/dL (ref 11.1–15.9)
MCH: 24.2 pg — ABNORMAL LOW (ref 26.6–33.0)
MCHC: 31.4 g/dL — ABNORMAL LOW (ref 31.5–35.7)
MCV: 77 fL — ABNORMAL LOW (ref 79–97)
Platelets: 361 x10E3/uL (ref 150–450)
RBC: 4.79 x10E6/uL (ref 3.77–5.28)
RDW: 15.3 % (ref 11.7–15.4)
WBC: 9.6 x10E3/uL (ref 3.4–10.8)

## 2024-03-04 LAB — HEPATITIS C ANTIBODY: Hep C Virus Ab: NONREACTIVE

## 2024-03-04 MED ORDER — TERBINAFINE HCL 250 MG PO TABS
250.0000 mg | ORAL_TABLET | Freq: Every day | ORAL | 1 refills | Status: AC
Start: 2024-03-04 — End: ?
  Filled 2024-03-04: qty 30, 30d supply, fill #0

## 2024-03-04 MED ORDER — ACYCLOVIR 5 % EX CREA
TOPICAL_CREAM | CUTANEOUS | 0 refills | Status: AC
  Filled 2024-03-04: qty 15, fill #0

## 2024-03-14 ENCOUNTER — Other Ambulatory Visit: Payer: Self-pay

## 2024-03-15 ENCOUNTER — Other Ambulatory Visit: Payer: Self-pay

## 2024-03-24 ENCOUNTER — Telehealth: Payer: Self-pay

## 2024-03-24 NOTE — Telephone Encounter (Signed)
 Telephoned patient at mobile number using interpreter#[phone]. Left a voice message with [facility] (scholarship) contact information.

## 2024-04-14 ENCOUNTER — Other Ambulatory Visit: Payer: Self-pay

## 2024-04-14 ENCOUNTER — Ambulatory Visit: Payer: Self-pay | Attending: Internal Medicine | Admitting: *Deleted

## 2024-04-14 ENCOUNTER — Encounter: Payer: Self-pay | Admitting: *Deleted

## 2024-04-14 VITALS — BP 113/69 | HR 54 | Resp 19 | Ht 63.0 in | Wt 171.0 lb

## 2024-04-14 DIAGNOSIS — B351 Tinea unguium: Secondary | ICD-10-CM

## 2024-04-14 MED ORDER — TERBINAFINE HCL 250 MG PO TABS
250.0000 mg | ORAL_TABLET | Freq: Every day | ORAL | 0 refills | Status: DC
  Filled 2024-04-14: qty 45, 45d supply, fill #0

## 2024-04-14 MED ORDER — TERBINAFINE HCL 250 MG PO TABS: 250.0000 mg | ORAL_TABLET | Freq: Every day | ORAL | Status: DC

## 2024-04-14 NOTE — Patient Instructions (Addendum)
 We discussed your onychomycosis (toenail fungus) 3 out of 10 toenails today. You have not had much progress with the Lamisil  treatment though it sounds like you have only taken 30 days worth of medication. Sometimes it takes much longer treatment Will check your liver functions today to make sure your liver is tolerating the medication and refill your Lamisil  for 45 days. We need to see you in follow-up in 45 days

## 2024-04-14 NOTE — Progress Notes (Unsigned)
 Patient ID: Katelyn Miller, female    DOB: [birth year]  MRN: [id number]  CC: Follow-up (Patient sates toenail fungus is cleared up a little better.)   Subjective: Katelyn Miller is a 43 y.o. female who presents for chronic ds management. Her concerns today include: She comes in today for follow-up of onychomycosis of the toenails She reports that she took 30 days of Lamisil  250 daily with little improvement. She does not have history of diabetes mellitus.  Sometimes she reports the toenails that are affected are painful. She has tried toenail polish to cover the unsightly nails.     Patient Active Problem List   Diagnosis Date Noted   S/P tubal ligation 12/16/2017   Anemia 12/15/2017   Advanced maternal age in multigravida 07/16/2017   Status post repeat low transverse cesarean section 02/16/2016     Medications Ordered Prior to Encounter[1]  Allergies[2]  Social History   Socioeconomic History   Marital status: Married    Spouse name: Not on file   Number of children: 5   Years of education: Not on file   Highest education level: 9th grade  Occupational History   Occupation: works in counsellor  Tobacco Use   Smoking status: Never   Smokeless tobacco: Never  Vaping Use   Vaping status: Never Used  Substance and Sexual Activity   Alcohol use: No   Drug use: No   Sexual activity: Yes    Birth control/protection: None  Other Topics Concern   Not on file  Social History Narrative   Reads in Spanish.   Social Drivers of Health   Tobacco Use: Low Risk (04/14/2024)   Patient History    Smoking Tobacco Use: Never    Smokeless Tobacco Use: Never    Passive Exposure: Not on file  Financial Resource Strain: Not on file  Food Insecurity: Not on file  Transportation Needs: Not on file  Physical Activity: Not on file  Stress: Not on file  Social Connections: Unknown (09/05/2022)   Received from [facility]   Social Network    Social  Network: Not on file  Intimate Partner Violence: Unknown (09/05/2022)   Received from [facility]   HITS    Physically Hurt: Not on file    Insult or Talk Down To: Not on file    Threaten Physical Harm: Not on file    Scream or Curse: Not on file  Depression (PHQ2-9): Low Risk (03/03/2024)   Depression (PHQ2-9)    PHQ-2 Score: 0  Alcohol Screen: Not on file  Housing: Not on file  Utilities: Not on file  Health Literacy: Not on file    Family History  Problem Relation Age of Onset   Healthy Mother    Healthy Father     Past Surgical History:  Procedure Laterality Date   CESAREAN SECTION     CESAREAN SECTION N/A 02/14/2016   Procedure: CESAREAN SECTION;  Surgeon: Elveria Mungo, MD;  Location: [facility] BIRTHING SUITES;  Service: Obstetrics;  Laterality: N/A;   CESAREAN SECTION WITH BILATERAL TUBAL LIGATION N/A 12/14/2017   Procedure: CESAREAN SECTION WITH BILATERAL TUBAL LIGATION;  Surgeon: Lorence Ozell CROME, MD;  Location: [facility] BIRTHING SUITES;  Service: Obstetrics;  Laterality: N/A;   right salpingectomy      ROS: Review of Systems Negative except as stated above  PHYSICAL EXAM: BP 113/69 (BP Location: Left Arm, Patient Position: Sitting, Cuff Size: Normal)   Pulse (!) 54   Resp 19   Ht 5' 3 (  1.6 m)   Wt 171 lb (77.6 kg)   LMP 04/10/2024 (Exact Date)   SpO2 98%   BMI 30.29 kg/m   Physical Exam Vitals and nursing note reviewed.  Constitutional:      Appearance: Normal appearance.  HENT:     Head: Normocephalic and atraumatic.     Right Ear: External ear normal.     Left Ear: External ear normal.     Nose: Nose normal.  Eyes:     Conjunctiva/sclera: Conjunctivae normal.  Cardiovascular:     Rate and Rhythm: Normal rate and regular rhythm.  Pulmonary:     Effort: Pulmonary effort is normal.     Breath sounds: Normal breath sounds.  Abdominal:     General: There is no distension.  Skin:    General: Skin is warm and dry.     Comments: Skin/nails there is 3  out of 10 toenails that show fungal involvement (third toenail on the right foot and third and fourth nails on the left  Neurological:     Mental Status: She is alert and oriented to person, place, and time. Mental status is at baseline.     Gait: Gait normal.          Latest Ref Rng & Units 03/03/2024    4:03 PM 01/23/2023    9:54 AM 01/04/2018   10:33 AM  CMP  Glucose 70 - 99 mg/dL 887  97  887   BUN 6 - 24 mg/dL 13  9  6    Creatinine 0.57 - 1.00 mg/dL 9.25  9.28  9.37   Sodium 134 - 144 mmol/L 138  140  137   Potassium 3.5 - 5.2 mmol/L 4.4  3.9  2.7   Chloride 96 - 106 mmol/L 101  104  102   CO2 20 - 29 mmol/L 21  22  24    Calcium 8.7 - 10.2 mg/dL 9.4  9.3  8.8   Total Protein 6.0 - 8.5 g/dL 6.9  6.8  6.6   Total Bilirubin 0.0 - 1.2 mg/dL <9.7  0.2  1.0   Alkaline Phos 41 - 116 IU/L 75  72  181   AST 0 - 40 IU/L 21  20  24    ALT 0 - 32 IU/L 20  18  30     Lipid Panel     Component Value Date/Time   CHOL 216 (H) 03/03/2024 1603   TRIG 291 (H) 03/03/2024 1603   HDL 47 03/03/2024 1603   CHOLHDL 4.6 (H) 03/03/2024 1603   LDLCALC 118 (H) 03/03/2024 1603    CBC    Component Value Date/Time   WBC 9.6 03/03/2024 1603   WBC 11.0 (H) 01/04/2018 1033   RBC 4.79 03/03/2024 1603   RBC 4.13 01/04/2018 1033   HGB 11.6 03/03/2024 1603   HCT 36.9 03/03/2024 1603   PLT 361 03/03/2024 1603   MCV 77 (L) 03/03/2024 1603   MCH 24.2 (L) 03/03/2024 1603   MCH 26.6 01/04/2018 1033   MCHC 31.4 (L) 03/03/2024 1603   MCHC 31.4 01/04/2018 1033   RDW 15.3 03/03/2024 1603   LYMPHSABS 2.0 01/04/2018 1033   MONOABS 1.3 (H) 01/04/2018 1033   EOSABS 0.0 01/04/2018 1033   BASOSABS 0.0 01/04/2018 1033    Results for orders placed or performed in visit on 03/03/24  CBC   Collection Time: 03/03/24  4:03 PM  Result Value Ref Range   WBC 9.6 3.4 - 10.8 x10E3/uL   RBC 4.79 3.77 -  5.28 x10E6/uL   Hemoglobin 11.6 11.1 - 15.9 g/dL   Hematocrit 63.0 65.9 - 46.6 %   MCV 77 (L) 79 - 97 fL   MCH  24.2 (L) 26.6 - 33.0 pg   MCHC 31.4 (L) 31.5 - 35.7 g/dL   RDW 84.6 88.2 - 84.5 %   Platelets 361 150 - 450 x10E3/uL  Comprehensive metabolic panel with GFR   Collection Time: 03/03/24  4:03 PM  Result Value Ref Range   Glucose 112 (H) 70 - 99 mg/dL   BUN 13 6 - 24 mg/dL   Creatinine, Ser 9.25 0.57 - 1.00 mg/dL   eGFR 895 >40 fO/fpw/8.26   BUN/Creatinine Ratio 18 9 - 23   Sodium 138 134 - 144 mmol/L   Potassium 4.4 3.5 - 5.2 mmol/L   Chloride 101 96 - 106 mmol/L   CO2 21 20 - 29 mmol/L   Calcium 9.4 8.7 - 10.2 mg/dL   Total Protein 6.9 6.0 - 8.5 g/dL   Albumin 4.4 3.9 - 4.9 g/dL   Globulin, Total 2.5 1.5 - 4.5 g/dL   Bilirubin Total <9.7 0.0 - 1.2 mg/dL   Alkaline Phosphatase 75 41 - 116 IU/L   AST 21 0 - 40 IU/L   ALT 20 0 - 32 IU/L  Lipid panel   Collection Time: 03/03/24  4:03 PM  Result Value Ref Range   Cholesterol, Total 216 (H) 100 - 199 mg/dL   Triglycerides 708 (H) 0 - 149 mg/dL   HDL 47 >60 mg/dL   VLDL Cholesterol Cal 51 (H) 5 - 40 mg/dL   LDL Chol Calc (NIH) 881 (H) 0 - 99 mg/dL   Chol/HDL Ratio 4.6 (H) 0.0 - 4.4 ratio  Hepatitis C Antibody   Collection Time: 03/03/24  4:03 PM  Result Value Ref Range   Hep C Virus Ab Non Reactive Non Reactive     ASSESSMENT AND PLAN:  Assessment & Plan Onychomycosis 3/10 toe nails.  Little improvement after 30 days of Lamisil  250 daily 04/15/2024 patient was informed that the treatment for toenail fungus would take 12 weeks of treatment. She is tolerating the Lamisil  without side effect Orders:   Hepatic Function Panel   terbinafine  (LAMISIL ) 250 MG tablet; Take 1 tablet (250 mg total) by mouth daily.      Patient was given the opportunity to ask questions.  Patient verbalized understanding of the plan and was able to repeat key elements of the plan.   This documentation was completed using Paediatric nurse.  Any transcriptional errors are unintentional.     Requested Prescriptions   Signed  Prescriptions Disp Refills   terbinafine  (LAMISIL ) 250 MG tablet 45 tablet 0    Sig: Take 1 tablet (250 mg total) by mouth daily.   terbinafine  (LAMISIL ) 250 MG tablet 45 tablet 0    Sig: Take 1 tablet (250 mg total) by mouth daily.    Return in about 6 weeks (around 05/26/2024).  Gita Dilger H, NP      [1]  Current Outpatient Medications on File Prior to Visit  Medication Sig Dispense Refill   acyclovir  cream (ZOVIRAX ) 5 % Apply 5 times daily on fever blister on upper lip for 3 days. Do not swallow (Patient not taking: Reported on 04/14/2024) 15 g 0   No current facility-administered medications on file prior to visit.  [2] No Known Allergies

## 2024-04-14 NOTE — Progress Notes (Unsigned)
 Interpreter ID# Celine

## 2024-04-15 ENCOUNTER — Ambulatory Visit: Payer: Self-pay | Admitting: *Deleted

## 2024-04-15 LAB — HEPATIC FUNCTION PANEL
ALT: 26 IU/L (ref 0–32)
AST: 26 IU/L (ref 0–40)
Albumin: 4.3 g/dL (ref 3.9–4.9)
Alkaline Phosphatase: 78 IU/L (ref 41–116)
Bilirubin Total: <0.2 mg/dL (ref 0.0–1.2)
Bilirubin, Direct: <0.08 mg/dL (ref 0.00–0.40)
Total Protein: 6.9 g/dL (ref 6.0–8.5)

## 2024-06-02 ENCOUNTER — Ambulatory Visit: Payer: Self-pay | Attending: Internal Medicine | Admitting: Internal Medicine

## 2024-06-02 ENCOUNTER — Encounter: Payer: Self-pay | Admitting: Internal Medicine

## 2024-06-02 VITALS — BP 101/61 | HR 58 | Ht 63.0 in | Wt 170.0 lb

## 2024-06-02 DIAGNOSIS — B351 Tinea unguium: Secondary | ICD-10-CM

## 2024-06-02 MED ORDER — TERBINAFINE HCL 250 MG PO TABS: 250.0000 mg | ORAL_TABLET | Freq: Every day | ORAL | 0 refills | Status: AC

## 2024-06-02 NOTE — Progress Notes (Signed)
 "   Patient ID: Katelyn Miller, female    DOB: 12-11-1980  MRN: [id number]  CC: Nail Problem (Nail fungus f/u./Reports fungus on bilateral toes have gotten better/Already received flu vax)   Subjective: Katelyn Miller is a 44 y.o. female who presents for f/u onychomycosis. Her chronic medical issues include:   [employer] Language interpreter used during this encounter. #[id number]   Discussed the use of AI scribe software for clinical note transcription with the patient, who gave verbal consent to proceed.  History of Present Illness Katelyn Miller is a 44 year old female who presents for follow-up of a fungal nail infection.  She has been taking Lamisil  for a little over two months to treat the fungal nail infection affecting the right fourth toe and the left third and fourth toes. She is tolerating the medication well and has noticed some improvement in the condition of her toenails.  She has not yet completed the full course of treatment, as she still has some medication left from the second container she received in December.    Patient Active Problem List   Diagnosis Date Noted   S/P tubal ligation 12/16/2017   Anemia 12/15/2017   Advanced maternal age in multigravida 07/16/2017   Status post repeat low transverse cesarean section 02/16/2016     Medications Ordered Prior to Encounter[1]  Allergies[2]  Social History   Socioeconomic History   Marital status: Married    Spouse name: Not on file   Number of children: 5   Years of education: Not on file   Highest education level: 9th grade  Occupational History   Occupation: works in counsellor  Tobacco Use   Smoking status: Never   Smokeless tobacco: Never  Vaping Use   Vaping status: Never Used  Substance and Sexual Activity   Alcohol use: No   Drug use: No   Sexual activity: Yes    Birth control/protection: None  Other Topics Concern   Not on file  Social History Narrative   Reads  in Spanish.   Social Drivers of Health   Tobacco Use: Low Risk (04/14/2024)   Patient History    Smoking Tobacco Use: Never    Smokeless Tobacco Use: Never    Passive Exposure: Not on file  Financial Resource Strain: Not on file  Food Insecurity: Not on file  Transportation Needs: Not on file  Physical Activity: Not on file  Stress: Not on file  Social Connections: Unknown (09/05/2022)   Received from [facility]   Social Network    Social Network: Not on file  Intimate Partner Violence: Unknown (09/05/2022)   Received from [facility]   HITS    Physically Hurt: Not on file    Insult or Talk Down To: Not on file    Threaten Physical Harm: Not on file    Scream or Curse: Not on file  Depression (PHQ2-9): Low Risk (03/03/2024)   Depression (PHQ2-9)    PHQ-2 Score: 0  Alcohol Screen: Not on file  Housing: Not on file  Utilities: Not on file  Health Literacy: Not on file    Family History  Problem Relation Age of Onset   Healthy Mother    Healthy Father     Past Surgical History:  Procedure Laterality Date   CESAREAN SECTION     CESAREAN SECTION N/A 02/14/2016   Procedure: CESAREAN SECTION;  Surgeon: Elveria Mungo, MD;  Location: [facility] BIRTHING SUITES;  Service: Obstetrics;  Laterality: N/A;   CESAREAN SECTION WITH  BILATERAL TUBAL LIGATION N/A 12/14/2017   Procedure: CESAREAN SECTION WITH BILATERAL TUBAL LIGATION;  Surgeon: Lorence Ozell CROME, MD;  Location: [facility] BIRTHING SUITES;  Service: Obstetrics;  Laterality: N/A;   right salpingectomy      ROS: Review of Systems Negative except as stated above  PHYSICAL EXAM: BP 101/61 (BP Location: Left Arm, Patient Position: Sitting, Cuff Size: Normal)   Pulse (!) 58   Ht 5' 3 (1.6 m)   Wt 170 lb (77.1 kg)   SpO2 100%   BMI 30.11 kg/m   Physical Exam  General appearance - alert, well appearing, and in no distress Mental status - normal mood, behavior, speech, dress, motor activity, and thought processes Skin -  moderate to severe spider veins on dorsum both feet. Nails: All of the nails are clipped too short.  As she has mild discoloration noted on the left 3rd and 4th toenails that has improved.  Also noted to have mild discoloration and thickening of the right 4th and 5th toenails.     Latest Ref Rng & Units 04/14/2024    3:56 PM 03/03/2024    4:03 PM 01/23/2023    9:54 AM  CMP  Glucose 70 - 99 mg/dL  887  97   BUN 6 - 24 mg/dL  13  9   Creatinine 9.42 - 1.00 mg/dL  9.25  9.28   Sodium 865 - 144 mmol/L  138  140   Potassium 3.5 - 5.2 mmol/L  4.4  3.9   Chloride 96 - 106 mmol/L  101  104   CO2 20 - 29 mmol/L  21  22   Calcium 8.7 - 10.2 mg/dL  9.4  9.3   Total Protein 6.0 - 8.5 g/dL 6.9  6.9  6.8   Total Bilirubin 0.0 - 1.2 mg/dL <9.7  <9.7  0.2   Alkaline Phos 41 - 116 IU/L 78  75  72   AST 0 - 40 IU/L 26  21  20    ALT 0 - 32 IU/L 26  20  18     Lipid Panel     Component Value Date/Time   CHOL 216 (H) 03/03/2024 1603   TRIG 291 (H) 03/03/2024 1603   HDL 47 03/03/2024 1603   CHOLHDL 4.6 (H) 03/03/2024 1603   LDLCALC 118 (H) 03/03/2024 1603    CBC    Component Value Date/Time   WBC 9.6 03/03/2024 1603   WBC 11.0 (H) 01/04/2018 1033   RBC 4.79 03/03/2024 1603   RBC 4.13 01/04/2018 1033   HGB 11.6 03/03/2024 1603   HCT 36.9 03/03/2024 1603   PLT 361 03/03/2024 1603   MCV 77 (L) 03/03/2024 1603   MCH 24.2 (L) 03/03/2024 1603   MCH 26.6 01/04/2018 1033   MCHC 31.4 (L) 03/03/2024 1603   MCHC 31.4 01/04/2018 1033   RDW 15.3 03/03/2024 1603   LYMPHSABS 2.0 01/04/2018 1033   MONOABS 1.3 (H) 01/04/2018 1033   EOSABS 0.0 01/04/2018 1033   BASOSABS 0.0 01/04/2018 1033    ASSESSMENT AND PLAN:  Assessment and Plan Assessment & Plan Onychomycosis Currently on Lamisil  for over two months with some improvement. Treatment duration is typically three months. Pt advised that toenails may not completely return to normal post-treatment. - Continue Lamisil  for two more weeks to complete  three months of treatment. - Avoid cutting toenails too short to prevent ingrown toenails. - Checked liver function tests monthly while on Lamisil . - Provided prescription for two more weeks of Lamisil  then  she would be done.   Patient was given the opportunity to ask questions.  Patient verbalized understanding of the plan and was able to repeat key elements of the plan.   This documentation was completed using Paediatric nurse.  Any transcriptional errors are unintentional.  Orders Placed This Encounter  Procedures   Hepatic Function Panel     Requested Prescriptions   Signed Prescriptions Disp Refills   terbinafine  (LAMISIL ) 250 MG tablet 15 tablet 0    Sig: Take 1 tablet (250 mg total) by mouth daily.    Return if symptoms worsen or fail to improve.  Barnie Louder, MD, FACP     [1]  Current Outpatient Medications on File Prior to Visit  Medication Sig Dispense Refill   acyclovir  cream (ZOVIRAX ) 5 % Apply 5 times daily on fever blister on upper lip for 3 days. Do not swallow (Patient not taking: Reported on 06/02/2024) 15 g 0   No current facility-administered medications on file prior to visit.  [2] No Known Allergies  "

## 2024-06-02 NOTE — Patient Instructions (Signed)
" °  VISIT SUMMARY: Today, you had a follow-up appointment to check on the progress of your fungal nail infection treatment.  YOUR PLAN: -ONYCHOMYCOSIS: Onychomycosis is a fungal infection of the nails. You have been taking Lamisil  for a little over two months and have noticed some improvement. The typical treatment duration is three months. Continue taking Lamisil  for two more weeks to complete the treatment. Avoid cutting your toenails too short to prevent ingrown toenails. Your liver function tests have been checked monthly while on Lamisil . A prescription for two more weeks of Lamisil  has been provided.  INSTRUCTIONS: Please continue taking Lamisil  for the next two weeks to complete the three-month treatment. Avoid cutting your toenails too short to prevent ingrown toenails. Ensure you follow up with your liver function tests as previously scheduled.    Contains text generated by [employer].   "

## 2024-06-03 ENCOUNTER — Ambulatory Visit: Payer: Self-pay | Admitting: Internal Medicine

## 2024-06-03 LAB — HEPATIC FUNCTION PANEL
ALT: 28 [IU]/L (ref 0–32)
AST: 24 [IU]/L (ref 0–40)
Albumin: 4.3 g/dL (ref 3.9–4.9)
Alkaline Phosphatase: 86 [IU]/L (ref 41–116)
Bilirubin Total: 0.3 mg/dL (ref 0.0–1.2)
Bilirubin, Direct: 0.12 mg/dL (ref 0.00–0.40)
Total Protein: 7 g/dL (ref 6.0–8.5)
